# Patient Record
Sex: Female | Born: 1980 | Race: White | Hispanic: No | Marital: Married | Smoking: Never smoker
Health system: Southern US, Community
[De-identification: ages and names within clinical notes are randomized; demographics above are authoritative.]

## PROBLEM LIST (undated history)

## (undated) DIAGNOSIS — Z87442 Personal history of urinary calculi: Secondary | ICD-10-CM

## (undated) DIAGNOSIS — T4145XA Adverse effect of unspecified anesthetic, initial encounter: Secondary | ICD-10-CM

## (undated) DIAGNOSIS — K429 Umbilical hernia without obstruction or gangrene: Secondary | ICD-10-CM

## (undated) DIAGNOSIS — T8859XA Other complications of anesthesia, initial encounter: Secondary | ICD-10-CM

## (undated) HISTORY — PX: CHOLECYSTECTOMY: SHX55

## (undated) HISTORY — PX: UMBILICAL HERNIA REPAIR: SHX196

## (undated) HISTORY — PX: TUBAL LIGATION: SHX77

## (undated) HISTORY — PX: WISDOM TOOTH EXTRACTION: SHX21

## (undated) SURGERY — LAPAROSCOPY, DIAGNOSTIC
Anesthesia: Choice

---

## 2007-02-08 ENCOUNTER — Emergency Department: Payer: Self-pay | Admitting: Emergency Medicine

## 2007-02-08 ENCOUNTER — Other Ambulatory Visit: Payer: Self-pay

## 2008-03-20 ENCOUNTER — Emergency Department: Payer: Self-pay | Admitting: Emergency Medicine

## 2008-03-30 ENCOUNTER — Emergency Department: Payer: Self-pay | Admitting: Emergency Medicine

## 2008-06-16 ENCOUNTER — Observation Stay: Payer: Self-pay

## 2008-08-09 ENCOUNTER — Observation Stay: Payer: Self-pay | Admitting: Unknown Physician Specialty

## 2008-08-25 ENCOUNTER — Observation Stay: Payer: Self-pay | Admitting: Unknown Physician Specialty

## 2008-09-07 ENCOUNTER — Observation Stay: Payer: Self-pay | Admitting: Unknown Physician Specialty

## 2008-09-09 ENCOUNTER — Observation Stay: Payer: Self-pay

## 2008-09-15 ENCOUNTER — Observation Stay: Payer: Self-pay

## 2008-09-16 ENCOUNTER — Observation Stay: Payer: Self-pay | Admitting: Obstetrics and Gynecology

## 2008-09-18 ENCOUNTER — Inpatient Hospital Stay: Payer: Self-pay | Admitting: Obstetrics & Gynecology

## 2008-10-26 ENCOUNTER — Emergency Department: Payer: Self-pay | Admitting: Emergency Medicine

## 2008-10-30 ENCOUNTER — Ambulatory Visit: Payer: Self-pay | Admitting: Obstetrics and Gynecology

## 2008-11-03 ENCOUNTER — Ambulatory Visit: Payer: Self-pay | Admitting: Obstetrics and Gynecology

## 2009-10-17 ENCOUNTER — Emergency Department: Payer: Self-pay | Admitting: Unknown Physician Specialty

## 2010-04-28 ENCOUNTER — Ambulatory Visit: Payer: Self-pay | Admitting: Obstetrics and Gynecology

## 2010-04-29 ENCOUNTER — Encounter (INDEPENDENT_AMBULATORY_CARE_PROVIDER_SITE_OTHER): Payer: Self-pay | Admitting: *Deleted

## 2010-04-29 ENCOUNTER — Ambulatory Visit (HOSPITAL_COMMUNITY)
Admission: RE | Admit: 2010-04-29 | Discharge: 2010-04-29 | Payer: Self-pay | Source: Home / Self Care | Attending: Family Medicine | Admitting: Family Medicine

## 2010-04-29 LAB — CONVERTED CEMR LAB

## 2010-05-18 ENCOUNTER — Ambulatory Visit
Admission: RE | Admit: 2010-05-18 | Discharge: 2010-05-18 | Payer: Self-pay | Source: Home / Self Care | Attending: Obstetrics and Gynecology | Admitting: Obstetrics and Gynecology

## 2010-05-19 ENCOUNTER — Encounter (INDEPENDENT_AMBULATORY_CARE_PROVIDER_SITE_OTHER): Payer: Self-pay | Admitting: *Deleted

## 2010-05-19 LAB — CONVERTED CEMR LAB
HCT: 45.8 % (ref 36.0–46.0)
HCV Ab: NEGATIVE
HIV: NONREACTIVE
MCHC: 29.5 g/dL — ABNORMAL LOW (ref 30.0–36.0)
MCV: 93.7 fL (ref 78.0–100.0)
RBC: 4.89 M/uL (ref 3.87–5.11)
WBC: 8 10*3/uL (ref 4.0–10.5)

## 2010-08-15 ENCOUNTER — Emergency Department: Payer: Self-pay | Admitting: Internal Medicine

## 2010-08-16 ENCOUNTER — Inpatient Hospital Stay: Payer: Self-pay | Admitting: Surgery

## 2010-09-13 HISTORY — PX: OTHER SURGICAL HISTORY: SHX169

## 2010-09-27 NOTE — Assessment & Plan Note (Signed)
Rhonda Rodgers, Rhonda Rodgers            ACCOUNT NO.:  [id number]   MEDICAL RECORD NO.:  [id number]          PATIENT TYPE:  POB   LOCATION:  [facility] at [facility]         FACILITY:  [facility]   PHYSICIAN:  Catalina Antigua, MD     DATE OF BIRTH:  10/08/1980   DATE OF SERVICE:  04/28/2010                                  CLINIC NOTE   This is a 30 year old, G4, P3-0-1-3 with LMP of April 20, 2010, who  presents today for evaluation of pelvic pain.  The patient states that  she has had lower pelvic pain for at least 5 years and it has gotten  progressively worse over the course of the 5 years.  The patient states  that during the past 5 years, she has had 2 pregnancies and the pelvic  pain has gotten worse during her pregnancies. However, medical  evaluation did not reveal any gross abnormalities explaining her pain.  The patient has lost insurance following her pregnancy and was not able  to continue seeking care for this pelvic pain and presents today after  obtaining medical insurance to continue medical care to determine the  etiology of her pain.  The patient describes the pain as a constant  cramping pain that is present almost daily and has been relieved in the  past with Tylenol or ibuprofen, but right now these 2 agents do not seem  to alleviate her pain.  The patient states that the pain is nonradiating  and remains in her lower pelvic area.  The patient states that the pain  if any thing seems to improve during her periods.  The patient denies  any abnormal discharge or abnormal bleeding.   PAST MEDICAL HISTORY:  Significant for a remote history of asthma.  Never hospitalized or intubated.   PAST SURGICAL HISTORY:  She has had a cholecystectomy.   PAST OBSTETRICAL HISTORY:  She has had 3 full-term vaginal deliveries  and one miscarriage.   PAST GYNECOLOGIC HISTORY:  She denies any history of cyst, fibroids or  abnormal Pap smear.  She is married and sexually active with her  husband.  She uses birth control for contraception.  Her last Pap smear  was in October 2010.   SOCIAL HISTORY:  She denies drinking, smoking or the use of illicit  drugs.   FAMILY HISTORY:  Significant for diabetes, hypertension.  Her mother was  recently diagnosed at the age of 34 with breast cancer and had undergone  a lumpectomy and is currently undergoing chemoradiation.  She has an  uncle who was deceased at the age of 110 from pancreatic cancer.   REVIEW OF SYSTEMS:  Significant for pain with intercourse particularly  when her pelvic pain is present and is associated with the different  position.   MEDICATION:  She is not currently taking any medications and she denies  any drug allergies.   PHYSICAL EXAMINATION:  Her blood pressure is 128/88, pulse of 78, weight  of 271 pounds, height of 5 feet 9 inches.  Her abdomen is soft,  nontender, nondistended, obese and on pelvic exam she had normal-  appearing external genitalia.  No abnormal bleeding or she had a thin  white  discharge.  No odor association, normal-appearing vaginal mucosa  and cervix on bimanual exam.  Physical exam was limited secondary to  body habitus, but no appreciable adnexal masses or enlarged uterus were  palpated.   ASSESSMENT AND PLAN:  This is a 30 year old, gravida 4, para 3-0-1-3  with a longstanding history of pelvic pain which she describes in the  suprapubic area and a wet prep was collected.  Pelvic ultrasound was  ordered.  The patient were to return in 2 weeks for discussion of the  results as well as a full physical exam.  It was explained to the  patient that if all test results were found to be normal that perhaps a  referral to urologist would be needed to rule out the presence of  interstitial cystitis.  At which point, the patient states that a  previous doctor had mentioned that she may have a bladder problem, but  because of lack of insurance she never followed up.  Upon further   questioning, the patient states that she has regular bowel movements  that are nonpainful.  The patient will return as mentioned in 2-3 weeks  for discussion of the results for physical exam and further planning.           ______________________________  Catalina Antigua, MD     PC/MEDQ  D:  04/28/2010  T:  04/29/2010  Job:  [id number]

## 2010-09-27 NOTE — Assessment & Plan Note (Signed)
Rhonda Rodgers, Rhonda Rodgers            ACCOUNT NO.:  [id number]   MEDICAL RECORD NO.:  [id number]          PATIENT TYPE:  POB   LOCATION:  [facility] at [facility]         FACILITY:  [facility]   PHYSICIAN:  Catalina Antigua, MD     DATE OF BIRTH:  06-Feb-1981   DATE OF SERVICE:  05/18/2010                                  CLINIC NOTE   This is a 30 year old G4, P 3-0-1-3 with LMP of April 20, 2010, who  presents today for annual exam and discussion of their results.  Ultrasound ordered for evaluation of chronic pelvic pain.  The patient  reports no change in her symptoms since her last visit on April 28, 2010, and states that her pelvic pain remains constant, seems to be  getting worse towards the end of the day, is aggravated by having the  need to have a bowel movement but not alleviated following the bowel  movement.  The patient also gives a history of changes of her bowel  movement approximately 15 months ago where she would have normal bowel  movement followed by a period of diarrhea, but then return to normal  bowel movements.  The patient denies any blood in her stools.  It has  never had been evaluated by a gastroenterologist.  The patient states  that her pain was improved on the Percocet but never resolved  completely.  Results of the ultrasounds were reviewed with the patient,  which demonstrated a uterus measuring 8 x 4 x 5 cm with an endometrial  lining of 5.5 mm.  Normal-appearing right and left ovary.  No pelvic  fluid or adnexal masses were seen.   PHYSICAL EXAMINATION:  VITAL SIGNS:  Her blood pressure is 112/80, pulse  of 71, weight of 273 pounds, height of 5 feet 9 inches.  LUNGS:  Clear to auscultation bilaterally.  HEART:  Regular rate and rhythm.  BREASTS:  Equal in size, nontender.  No palpable masses or  lymphadenopathy.  No expressible nipple discharge.  No skin dimpling.  ABDOMEN:  Soft, nontender, nondistended, obese.  PELVIC:  It was limited secondary to body  habitus but no palpable masses  or tenderness was elicited on exam.   ASSESSMENT AND PLAN:  This is a 30 year old G18, P 3-0-1-3 with 5-year  history of pelvic pain who presents today for followup and a physical  exam.  Pap smear and cultures were collected.  The patient also desired  full STD testing and was tested for hepatitis B, C, HIV, and syphilis.  The patient will be contacted with any abnormal results.  The patient  was referred to a gastroenterologist in view of her GI symptoms to rule  out irritable bowel syndrome.  If the GI workup turns out to be  negative, perhaps a referral to a urologist to rule out interstitial  cystitis would be warranted.  The patient verbalized understanding.  All  questions were answered.  The patient will return in a year or p.r.n.           ______________________________  Catalina Antigua, MD     PC/MEDQ  D:  05/18/2010  T:  05/18/2010  Job:  [id number]

## 2010-11-29 ENCOUNTER — Encounter: Payer: Self-pay | Admitting: Family Medicine

## 2010-11-29 ENCOUNTER — Ambulatory Visit (INDEPENDENT_AMBULATORY_CARE_PROVIDER_SITE_OTHER): Payer: No Typology Code available for payment source | Admitting: Family Medicine

## 2010-11-29 DIAGNOSIS — E669 Obesity, unspecified: Secondary | ICD-10-CM | POA: Insufficient documentation

## 2010-11-29 DIAGNOSIS — N949 Unspecified condition associated with female genital organs and menstrual cycle: Secondary | ICD-10-CM

## 2010-11-29 DIAGNOSIS — R102 Pelvic and perineal pain: Secondary | ICD-10-CM | POA: Insufficient documentation

## 2010-11-29 NOTE — Progress Notes (Signed)
  Subjective:    Patient ID: Rhonda Rodgers, female    DOB: 03/17/1981, 30 y.o.   MRN: [id number]  HPI Comments: Here today for continued complaints of pelvic pain.  She has not completed GI work-up and has had emergency herniorrhaphy since last visit.  She has pain that is low in the midline and crampy in nature.  It is not cyclical in nature.  She has a normal pelvic sonogram.  I am unclear that her pain is gynecological.  She is using Ibuprofen to control it, but it is not working well.  She has no specific GI or GU symptoms.    Pelvic Pain The patient's primary symptoms include pelvic pain. The patient's pertinent negatives include no vaginal discharge. Pertinent negatives include no abdominal pain, back pain, fever, flank pain, headaches or hematuria.      Review of Systems  Constitutional: Positive for activity change (calls out of work frequently). Negative for fever.  Respiratory: Negative for shortness of breath and wheezing.   Cardiovascular: Negative for chest pain.  Gastrointestinal: Negative for abdominal pain and abdominal distention.  Genitourinary: Positive for pelvic pain. Negative for hematuria, flank pain, vaginal bleeding, vaginal discharge, vaginal pain and menstrual problem.  Musculoskeletal: Negative for myalgias and back pain.  Neurological: Negative for syncope and headaches.  Psychiatric/Behavioral: Negative for dysphoric mood. The patient is not nervous/anxious.        Objective:   Physical Exam  Constitutional: She appears well-developed and well-nourished.  HENT:  Head: Normocephalic and atraumatic.  Neck: Normal range of motion. Neck supple.  Cardiovascular: Normal rate and regular rhythm.   Pulmonary/Chest: Effort normal.  Abdominal: Soft. Bowel sounds are normal. There is no tenderness.          Assessment & Plan:  Pelvic Pain/Abdominal pain-unclear etiology.  ? GYN vs GI vs. IC.  Certainly is not typical of any.  Pt. Is asking about  hysterectomy--Lengthy discussion about hysterectomy for no pathology which may have complications-discussed at length, and may not fix pain.  We have compromised with her returning to GI and scheduling a diagnostic laparoscopy.  Pt. With previous hernia---will consider RUQ camera placement.

## 2010-12-14 HISTORY — PX: OTHER SURGICAL HISTORY: SHX169

## 2010-12-28 ENCOUNTER — Encounter (HOSPITAL_COMMUNITY)
Admission: RE | Admit: 2010-12-28 | Discharge: 2010-12-28 | Disposition: A | Discharge status: Home / Self Care | Payer: BC Managed Care – PPO | Source: Ambulatory Visit | Attending: Family Medicine | Admitting: Family Medicine

## 2010-12-28 ENCOUNTER — Encounter (HOSPITAL_COMMUNITY): Payer: Self-pay

## 2010-12-28 HISTORY — DX: Other complications of anesthesia, initial encounter: T88.59XA

## 2010-12-28 HISTORY — DX: Adverse effect of unspecified anesthetic, initial encounter: T41.45XA

## 2010-12-28 LAB — CBC
HCT: 41 % (ref 36.0–46.0)
Platelets: 220 10*3/uL (ref 150–400)
RBC: 4.79 MIL/uL (ref 3.87–5.11)
RDW: 13.5 % (ref 11.5–15.5)
WBC: 5.8 10*3/uL (ref 4.0–10.5)

## 2010-12-28 NOTE — Patient Instructions (Signed)
20 WILLISTINE FERRALL  12/28/2010   Your procedure is scheduled on:  01/09/11  Report to [facility] at 0730 AM.  Call this number if you have problems the morning of surgery: [phone]   Remember:   Do not eat food:After Midnight.  Do not drink clear liquids: After Midnight.  Take these medicines the morning of surgery with A SIP OF WATER: none   Do not wear jewelry, make-up or nail polish.  Do not wear lotions, powders, or perfumes. You may wear deodorant.  Do not shave 48 hours prior to surgery.  Do not bring valuables to the hospital.  Contacts, dentures or bridgework may not be worn into surgery.  Leave suitcase in the car. After surgery it may be brought to your room.  For patients admitted to the hospital, checkout time is 11:00 AM the day of discharge.   Patients discharged the day of surgery will not be allowed to drive home.  Name and phone number of your driver:Richard -[phone]  Special Instructions: CHG Shower Use Special Wash: 1/2 bottle night before surgery and 1/2 bottle morning of surgery.   Please read over the following fact sheets that you were given: MRSA

## 2011-01-09 ENCOUNTER — Encounter (HOSPITAL_COMMUNITY)
Admission: RE | Disposition: A | Discharge status: Home / Self Care | Payer: Self-pay | Source: Ambulatory Visit | Attending: Family Medicine

## 2011-01-09 ENCOUNTER — Ambulatory Visit (HOSPITAL_COMMUNITY): Payer: BC Managed Care – PPO | Admitting: Anesthesiology

## 2011-01-09 ENCOUNTER — Encounter (HOSPITAL_COMMUNITY): Payer: Self-pay | Admitting: Anesthesiology

## 2011-01-09 ENCOUNTER — Ambulatory Visit (HOSPITAL_COMMUNITY)
Admission: RE | Admit: 2011-01-09 | Discharge: 2011-01-09 | Disposition: A | Discharge status: Home / Self Care | Payer: BC Managed Care – PPO | Source: Ambulatory Visit | Attending: Family Medicine | Admitting: Family Medicine

## 2011-01-09 ENCOUNTER — Encounter (HOSPITAL_COMMUNITY): Payer: Self-pay | Admitting: *Deleted

## 2011-01-09 DIAGNOSIS — IMO0002 Reserved for concepts with insufficient information to code with codable children: Secondary | ICD-10-CM

## 2011-01-09 DIAGNOSIS — Z9889 Other specified postprocedural states: Secondary | ICD-10-CM

## 2011-01-09 DIAGNOSIS — Z01818 Encounter for other preprocedural examination: Secondary | ICD-10-CM | POA: Insufficient documentation

## 2011-01-09 DIAGNOSIS — Z01812 Encounter for preprocedural laboratory examination: Secondary | ICD-10-CM | POA: Insufficient documentation

## 2011-01-09 DIAGNOSIS — R102 Pelvic and perineal pain: Secondary | ICD-10-CM

## 2011-01-09 DIAGNOSIS — N949 Unspecified condition associated with female genital organs and menstrual cycle: Secondary | ICD-10-CM | POA: Insufficient documentation

## 2011-01-09 HISTORY — PX: LAPAROSCOPY: SHX197

## 2011-01-09 LAB — PREGNANCY, URINE: Preg Test, Ur: NEGATIVE

## 2011-01-09 SURGERY — LAPAROSCOPY, DIAGNOSTIC
Anesthesia: General | Site: Abdomen | Wound class: Clean Contaminated

## 2011-01-09 MED ORDER — PROPOFOL 10 MG/ML IV EMUL
INTRAVENOUS | Status: DC | PRN
  Administered 2011-01-09: 200 mg via INTRAVENOUS

## 2011-01-09 MED ORDER — KETOROLAC TROMETHAMINE 30 MG/ML IJ SOLN
INTRAMUSCULAR | Status: DC | PRN
  Administered 2011-01-09: 30 mg via INTRAVENOUS

## 2011-01-09 MED ORDER — BUPIVACAINE HCL (PF) 0.25 % IJ SOLN
INTRAMUSCULAR | Status: DC | PRN
  Administered 2011-01-09: 4 mL

## 2011-01-09 MED ORDER — CEFAZOLIN SODIUM 1-5 GM-% IV SOLN: 1.0000 g | INTRAVENOUS | Status: DC

## 2011-01-09 MED ORDER — MIDAZOLAM HCL 2 MG/2ML IJ SOLN
INTRAMUSCULAR | Status: AC
  Filled 2011-01-09: qty 2

## 2011-01-09 MED ORDER — LACTATED RINGERS IV SOLN
INTRAVENOUS | Status: DC
  Administered 2011-01-09: 1000 mL via INTRAVENOUS
  Administered 2011-01-09: 10:00:00 via INTRAVENOUS

## 2011-01-09 MED ORDER — LIDOCAINE HCL (CARDIAC) 20 MG/ML IV SOLN
INTRAVENOUS | Status: AC
  Filled 2011-01-09: qty 5

## 2011-01-09 MED ORDER — CEFAZOLIN SODIUM 1-5 GM-% IV SOLN
INTRAVENOUS | Status: AC
  Filled 2011-01-09: qty 50

## 2011-01-09 MED ORDER — NEOSTIGMINE METHYLSULFATE 1 MG/ML IJ SOLN
INTRAMUSCULAR | Status: DC | PRN
  Administered 2011-01-09: 5 mg via INTRAMUSCULAR

## 2011-01-09 MED ORDER — OXYCODONE-ACETAMINOPHEN 5-325 MG PO TABS
ORAL_TABLET | ORAL | Status: AC
  Administered 2011-01-09: 1
  Filled 2011-01-09: qty 1

## 2011-01-09 MED ORDER — OXYCODONE-ACETAMINOPHEN 5-325 MG PO TABS: 1.0000 | ORAL_TABLET | ORAL | Status: DC | PRN

## 2011-01-09 MED ORDER — ROCURONIUM BROMIDE 100 MG/10ML IV SOLN
INTRAVENOUS | Status: DC | PRN
  Administered 2011-01-09: 50 mg via INTRAVENOUS

## 2011-01-09 MED ORDER — OXYCODONE-ACETAMINOPHEN 5-325 MG PO TABS: 1.0000 | ORAL_TABLET | ORAL | Status: AC | PRN

## 2011-01-09 MED ORDER — CEFAZOLIN SODIUM 1-5 GM-% IV SOLN
INTRAVENOUS | Status: DC | PRN
  Administered 2011-01-09: 1 g via INTRAVENOUS

## 2011-01-09 MED ORDER — GLYCOPYRROLATE 0.2 MG/ML IJ SOLN
INTRAMUSCULAR | Status: AC
  Filled 2011-01-09: qty 1

## 2011-01-09 MED ORDER — MIDAZOLAM HCL 5 MG/5ML IJ SOLN
INTRAMUSCULAR | Status: DC | PRN
  Administered 2011-01-09: 2 mg via INTRAVENOUS

## 2011-01-09 MED ORDER — ONDANSETRON HCL 4 MG/2ML IJ SOLN
INTRAMUSCULAR | Status: AC
  Filled 2011-01-09: qty 2

## 2011-01-09 MED ORDER — LIDOCAINE HCL (CARDIAC) 20 MG/ML IV SOLN
INTRAVENOUS | Status: DC | PRN
  Administered 2011-01-09: 100 mg via INTRAVENOUS

## 2011-01-09 MED ORDER — FENTANYL CITRATE 0.05 MG/ML IJ SOLN
INTRAMUSCULAR | Status: AC
  Filled 2011-01-09: qty 5

## 2011-01-09 MED ORDER — FENTANYL CITRATE 0.05 MG/ML IJ SOLN
INTRAMUSCULAR | Status: DC | PRN
  Administered 2011-01-09: 150 ug via INTRAVENOUS
  Administered 2011-01-09: 100 ug via INTRAVENOUS

## 2011-01-09 MED ORDER — DEXAMETHASONE SODIUM PHOSPHATE 4 MG/ML IJ SOLN
INTRAMUSCULAR | Status: DC | PRN
  Administered 2011-01-09: 10 mg via INTRAVENOUS

## 2011-01-09 MED ORDER — PROPOFOL 10 MG/ML IV EMUL
INTRAVENOUS | Status: AC
  Filled 2011-01-09: qty 20

## 2011-01-09 MED ORDER — NEOSTIGMINE METHYLSULFATE 1 MG/ML IJ SOLN
INTRAMUSCULAR | Status: AC
  Filled 2011-01-09: qty 10

## 2011-01-09 MED ORDER — ROCURONIUM BROMIDE 50 MG/5ML IV SOLN
INTRAVENOUS | Status: AC
  Filled 2011-01-09: qty 1

## 2011-01-09 MED ORDER — ONDANSETRON HCL 4 MG/2ML IJ SOLN
INTRAMUSCULAR | Status: DC | PRN
  Administered 2011-01-09: 4 mg via INTRAVENOUS

## 2011-01-09 MED ORDER — DEXAMETHASONE SODIUM PHOSPHATE 10 MG/ML IJ SOLN
INTRAMUSCULAR | Status: AC
  Filled 2011-01-09: qty 1

## 2011-01-09 MED ORDER — GLYCOPYRROLATE 0.2 MG/ML IJ SOLN
INTRAMUSCULAR | Status: DC | PRN
  Administered 2011-01-09: .8 mg via INTRAVENOUS

## 2011-01-09 SURGICAL SUPPLY — 23 items
CABLE HIGH FREQUENCY MONO STRZ (ELECTRODE) IMPLANT
CATH ROBINSON RED A/P 16FR (CATHETERS) IMPLANT
CHLORAPREP W/TINT 26ML (MISCELLANEOUS) ×2 IMPLANT
CLOTH BEACON ORANGE TIMEOUT ST (SAFETY) ×2 IMPLANT
DERMABOND ADVANCED (GAUZE/BANDAGES/DRESSINGS) ×2 IMPLANT
GLOVE BIOGEL PI IND STRL 7.0 (GLOVE) ×1 IMPLANT
GLOVE BIOGEL PI INDICATOR 7.0 (GLOVE) ×1
GLOVE ECLIPSE 7.0 STRL STRAW (GLOVE) ×4 IMPLANT
GOWN PREVENTION PLUS LG XLONG (DISPOSABLE) ×4 IMPLANT
GOWN PREVENTION PLUS XLARGE (GOWN DISPOSABLE) ×2 IMPLANT
NS IRRIG 1000ML POUR BTL (IV SOLUTION) ×2 IMPLANT
PACK LAPAROSCOPY BASIN (CUSTOM PROCEDURE TRAY) ×2 IMPLANT
SET IRRIG TUBING LAPAROSCOPIC (IRRIGATION / IRRIGATOR) IMPLANT
SUT VIC AB 3-0 X1 27 (SUTURE) ×2 IMPLANT
SUT VICRYL 0 UR6 27IN ABS (SUTURE) ×4 IMPLANT
SUT VICRYL 4-0 PS2 18IN ABS (SUTURE) ×2 IMPLANT
TOWEL OR 17X24 6PK STRL BLUE (TOWEL DISPOSABLE) ×4 IMPLANT
TRAY FOLEY CATH 14FR (SET/KITS/TRAYS/PACK) ×2 IMPLANT
TROCAR BALLN 12MMX100 BLUNT (TROCAR) IMPLANT
TROCAR Z-THREAD BLADED 11X100M (TROCAR) IMPLANT
TROCAR Z-THREAD BLADED 5X100MM (TROCAR) ×4 IMPLANT
WARMER LAPAROSCOPE (MISCELLANEOUS) ×2 IMPLANT
WATER STERILE IRR 1000ML POUR (IV SOLUTION) ×2 IMPLANT

## 2011-01-09 NOTE — Op Note (Signed)
Preoperative diagnosis: Pelvic pain  Postoperative diagnosis: Same Procedure: Diagnostic laparoscopy Surgeon: Tinnie Gens, MD Anesthesia:  GETT-Hatchett Findings: Normal appearing uterus, tubes, ovaries.  Fallope ring noted on left tube, possible venous congestion.  No evidence of endometriosis.  Normal anterior and posterior cul-de-sacs.  Normal liver edge, no significant adhesive disease. Estimated blood loss: Minimal Complications: None known Specimens: None Reason for procedure: Patient is a 30 y.o. [id number], with long standing history of chronic pelvic pain.  She has had pain that has worsened with pregnancy.  She is being worked up by GI, and is for laparoscopy for diagnostic purposes. Procedure: Patient was taken to the operating room was placed in dorsal lithotomy in Clemons stirrups. She was prepped and draped in the usual sterile fashion. A timeout was performed. The patient received 1 g of Ancef and SCDs were in place. Foley catheter is used to drain bladder. Speculum was placed inside the vagina. The cervix was visualized and grasped anteriorly with a single-tooth tenaculum. A Hulka tenaculum was placed through the cervix for uterine manipulation. The single tooth tenaculum and speculum were removed from the vagina.  Attention was then turned to the abdomen. Four cc of 0.25% Marcaine was injected at the umbilicus.  Further cleaning with Betadine and swabs and 4 x 4's were used to continue to clean the umbilicus. Two Allis clamps were used to tent up the skin of the umbilicus a vertical one half centimeter incision was made here. The fascia was incised with the knife  And the peritoneum tented was entered sharply with this incision. Two edges of the fascia were tagged with a 0 Vicryl suture on a UR 6 needle. A Hassan trocar was placed through this incision and a pneumoperitoneum was created. The patient was then placed in Trendelenburg. The pelvis was inspected in a systematic fashion. The  patient's right and left tubes, presence of Fallope ring on left tube, and ovaries appeared normal. There was evidence of vascular congestion noted bilaterally.  The posterior and anterior cul-de-sacs were inspected and felt to be normal. The ovarian fossa were inspected and also felt to be normal. The appendix was not seen.. The upper abdomen was inspected the liver edge gallbladder and stomach appeared normal. There were no additional findings in the pelvis and so the procedure was terminated. All instrument, needle  and lap counts were correct x 2 the patient was awakened to recovery in stable condition.

## 2011-01-09 NOTE — Anesthesia Procedure Notes (Signed)
Procedure Name: Intubation Date/Time: 01/09/2011 9:20 AM Performed by: Isabella Bowens Pre-anesthesia Checklist: Patient identified, Emergency Drugs available, Suction available, Timeout performed and Patient being monitored Patient Re-evaluated:Patient Re-evaluated prior to inductionOxygen Delivery Method: Circle System Utilized Preoxygenation: Pre-oxygenation with 100% oxygen Intubation Type: IV induction Ventilation: Mask ventilation without difficulty Laryngoscope Size: Mac and 3 Grade View: Grade III Tube type: Oral Tube size: 7.0 mm Number of attempts: 1 Airway Equipment and Method: stylet Placement Confirmation: ETT inserted through vocal cords under direct vision,  positive ETCO2 and breath sounds checked- equal and bilateral Secured at: 22 cm Tube secured with: Tape Dental Injury: Teeth and Oropharynx as per pre-operative assessment

## 2011-01-09 NOTE — Anesthesia Postprocedure Evaluation (Signed)
Anesthesia Post Note  Patient: Rhonda Rodgers  Procedure(s) Performed:  LAPAROSCOPY DIAGNOSTIC  Anesthesia type: General  Patient location: PACU  Post pain: Pain level controlled  Post assessment: Post-op Vital signs reviewed  Last Vitals:  Filed Vitals:   01/09/11 1014  BP: 110/54  Pulse: 80  Temp: 97.9 F (36.6 C)  Resp: 16    Post vital signs: Reviewed  Level of consciousness: sedated  Complications: No apparent anesthesia complicationsfj

## 2011-01-09 NOTE — Transfer of Care (Signed)
  Anesthesia Post-op Note  Patient: Rhonda Rodgers  Procedure(s) Performed:  LAPAROSCOPY DIAGNOSTIC  Patient Location: PACU  Anesthesia Type: General  Level of Consciousness: sedated and patient cooperative  Airway and Oxygen Therapy: Patient Spontanous Breathing and Patient connected to nasal cannula oxygen  Post-op Pain: mild  Post-op Assessment: Post-op Vital signs reviewed  Post-op Vital Signs: Reviewed and stable  Complications: No apparent anesthesia complications

## 2011-01-09 NOTE — Anesthesia Preprocedure Evaluation (Addendum)
Anesthesia Evaluation  Name, MR# and DOB Patient awake  General Assessment Comment  Reviewed: Allergy & Precautions, H&P , Patient's Chart, lab work & pertinent test results, reviewed documented beta blocker date and time   History of Anesthesia Complications Negative for: history of anesthetic complications  Airway Mallampati: II TM Distance: >3 FB Neck ROM: full    Dental  (+) Teeth Intact   Pulmonary  asthma  clear to auscultation  pulmonary exam normalPulmonary Exam Normal breath sounds clear to auscultation none    Cardiovascular     Neuro/Psych Negative Neurological ROS  Negative Psych ROS  GI/Hepatic/Renal negative GI ROS  negative Liver ROS  negative Renal ROS        Endo/Other  (+)   Morbid obesity  Abdominal Normal abdominal exam  (+) obese,   Musculoskeletal negative musculoskeletal ROS (+)   Hematology negative hematology ROS (+)   Peds  Reproductive/Obstetrics negative OB ROS    Anesthesia Other Findings            Anesthesia Physical Anesthesia Plan  ASA: III  Anesthesia Plan: General   Post-op Pain Management:    Induction: Intravenous  Airway Management Planned: Oral ETT  Additional Equipment:   Intra-op Plan:   Post-operative Plan: Extubation in OR  Informed Consent: I have reviewed the patients History and Physical, chart, labs and discussed the procedure including the risks, benefits and alternatives for the proposed anesthesia with the patient or authorized representative who has indicated his/her understanding and acceptance.   Dental Advisory Given and History available from chart only  Plan Discussed with: CRNA  Anesthesia Plan Comments:       Anesthesia Quick Evaluation

## 2011-01-09 NOTE — H&P (Signed)
Rhonda Rodgers is an 30 y.o. female. She has chronic pelvic pain of unclear etiology.  Pertinent Gynecological History:  Previous GYN Procedures: BTL   Last pap: normal Date:  OB History: G4, P3013   Menstrual History:  Patient's last menstrual period was 12/14/2010.    Past Medical History  Diagnosis Date  . Asthma     yrs ago  . Complication of anesthesia     claustrobia- doesn't want O2 mask on face- will become agitated    Past Surgical History  Procedure Date  . Cholecystectomy   . Herina excision 09/2010  . Umbilical hernia repair   . Tubal ligation     Family History  Problem Relation Age of Onset  . Diabetes Mother   . Cancer Mother   . Hypertension Father     Social History:  reports that she has never smoked. She has never used smokeless tobacco. She reports that she does not drink alcohol or use illicit drugs.  Allergies: No Known Allergies  Prescriptions prior to admission  Medication Sig Dispense Refill  . acetaminophen (TYLENOL) 500 MG tablet Take 500 mg by mouth as needed. Pt. Takes 1-2 tablest per day in between ibuprofen as needed for pain.       Marland Kitchen ibuprofen (ADVIL,MOTRIN) 800 MG tablet Take 800 mg by mouth every 8 (eight) hours as needed.          Review of Systems  Constitutional: Negative for fever, chills, malaise/fatigue and diaphoresis.  HENT: Negative for neck pain.   Respiratory: Negative for cough and hemoptysis.   Cardiovascular: Negative for chest pain and leg swelling.  Gastrointestinal: Positive for abdominal pain. Negative for nausea and vomiting.  Genitourinary: Negative for dysuria and urgency.  Musculoskeletal: Negative for myalgias and back pain.  Neurological: Negative for dizziness.  Psychiatric/Behavioral: Negative for depression and suicidal ideas.    Blood pressure 110/71, pulse 72, temperature 98.1 F (36.7 C), temperature source Oral, resp. rate 18, last menstrual period 12/14/2010, SpO2 98.00%. Physical Exam    Constitutional: She appears well-developed and well-nourished.  HENT:  Head: Normocephalic.  Neck: Normal range of motion. Neck supple.  Cardiovascular: Normal rate and regular rhythm.   Respiratory: Effort normal and breath sounds normal.  GI: Bowel sounds are normal. There is tenderness.  Genitourinary: Vagina normal and uterus normal.  Musculoskeletal: Normal range of motion.  Neurological: She is alert.  Skin: Skin is warm.  Psychiatric: She has a normal mood and affect.    Results for orders placed during the hospital encounter of 01/09/11 (from the past 24 hour(s))  PREGNANCY, URINE     Status: Normal   Collection Time   01/09/11  7:45 AM      Component Value Range   Preg Test, Ur NEGATIVE      No results found.  Assessment/Plan: Chronic pelvic pain of unclear etiology. For diagnostic laparoscopy.  Tenesha Garza S 01/09/2011, 8:13 AM

## 2011-01-24 ENCOUNTER — Ambulatory Visit (INDEPENDENT_AMBULATORY_CARE_PROVIDER_SITE_OTHER): Payer: No Typology Code available for payment source | Admitting: Family Medicine

## 2011-01-24 ENCOUNTER — Encounter: Payer: Self-pay | Admitting: Family Medicine

## 2011-01-24 VITALS — BP 112/76 | HR 67 | Wt 272.0 lb

## 2011-01-24 DIAGNOSIS — Z9889 Other specified postprocedural states: Secondary | ICD-10-CM

## 2011-01-24 DIAGNOSIS — R102 Pelvic and perineal pain: Secondary | ICD-10-CM

## 2011-01-24 DIAGNOSIS — N949 Unspecified condition associated with female genital organs and menstrual cycle: Secondary | ICD-10-CM

## 2011-01-24 NOTE — Assessment & Plan Note (Signed)
?   Pelvic congestion syndrome--? Interventional radiology--venography and sclerotherapy

## 2011-01-24 NOTE — Progress Notes (Signed)
Addended by: Reva Bores on: 01/24/2011 02:44 PM   Modules accepted: Orders

## 2011-01-24 NOTE — Progress Notes (Signed)
  Subjective:    Patient ID: Rhonda Rodgers, female    DOB: 1981-04-17, 30 y.o.   MRN: [id number]  HPI S/p dx laparoscopy for chronic pelvic pain.  Pt. Continues to have pelvic pain.  Pt. Had a nml laparoscopy except for pelvic congestion, which may be the cause of her pain.  Had negative colonoscopy 3 wks ago.  GI placed pt. On probiotics.   Review of Systems  Constitutional: Negative for activity change.  HENT: Negative for sinus pressure.   Respiratory: Negative for shortness of breath.   Cardiovascular: Negative for chest pain.  Gastrointestinal: Negative for abdominal pain and constipation.  Genitourinary: Positive for pelvic pain. Negative for dysuria.  Musculoskeletal: Negative for arthralgias.       Objective:   Physical Exam  Vitals reviewed. Constitutional: She appears well-developed and well-nourished.  HENT:  Head: Normocephalic and atraumatic.  Cardiovascular: Normal rate.   Pulmonary/Chest: Effort normal.  Abdominal: Soft.  Skin: Skin is warm and dry.       Incision is healing well.          Assessment & Plan:  ? Pelvic congestion syndrome--will discuss with Interventional Radiology about venography for diagnosis +/- sclerotherapy for treatment. Discussed with Dr. Bonnielee Haff and he will call patient and schedule venography.

## 2011-01-25 ENCOUNTER — Ambulatory Visit
Admission: RE | Admit: 2011-01-25 | Discharge: 2011-01-25 | Disposition: A | Discharge status: Home / Self Care | Payer: No Typology Code available for payment source | Source: Ambulatory Visit | Attending: Family Medicine | Admitting: Family Medicine

## 2011-01-25 VITALS — BP 126/69 | HR 87 | Temp 97.8°F | Resp 14 | Ht 69.0 in | Wt 270.0 lb

## 2011-01-25 DIAGNOSIS — R102 Pelvic and perineal pain: Secondary | ICD-10-CM

## 2011-02-01 ENCOUNTER — Other Ambulatory Visit (HOSPITAL_COMMUNITY): Payer: Self-pay | Admitting: Interventional Radiology

## 2011-02-01 DIAGNOSIS — N9489 Other specified conditions associated with female genital organs and menstrual cycle: Secondary | ICD-10-CM

## 2011-02-28 ENCOUNTER — Ambulatory Visit (HOSPITAL_COMMUNITY): Payer: BC Managed Care – PPO

## 2011-02-28 ENCOUNTER — Observation Stay (HOSPITAL_COMMUNITY)
Admission: AD | Admit: 2011-02-28 | Discharge: 2011-03-01 | Disposition: A | Discharge status: Home / Self Care | Payer: BC Managed Care – PPO | Source: Ambulatory Visit | Attending: Interventional Radiology | Admitting: Interventional Radiology

## 2011-02-28 ENCOUNTER — Other Ambulatory Visit (HOSPITAL_COMMUNITY): Payer: Self-pay | Admitting: Interventional Radiology

## 2011-02-28 DIAGNOSIS — N9489 Other specified conditions associated with female genital organs and menstrual cycle: Secondary | ICD-10-CM

## 2011-02-28 HISTORY — PX: EMBOLIZATION: SHX1496

## 2011-02-28 LAB — BASIC METABOLIC PANEL
Chloride: 104 mEq/L (ref 96–112)
GFR calc Af Amer: >90 mL/min (ref >90)
GFR calc non Af Amer: >90 mL/min (ref >90)
Potassium: 3.7 mEq/L (ref 3.5–5.1)
Sodium: 137 mEq/L (ref 135–145)

## 2011-02-28 LAB — CBC
HCT: 38.4 % (ref 36.0–46.0)
MCHC: 32.3 g/dL (ref 30.0–36.0)
Platelets: 223 10*3/uL (ref 150–400)
RDW: 13.8 % (ref 11.5–15.5)
WBC: 5.8 10*3/uL (ref 4.0–10.5)

## 2011-02-28 LAB — HCG, SERUM, QUALITATIVE: Preg, Serum: NEGATIVE

## 2011-02-28 MED ORDER — IOHEXOL 300 MG/ML  SOLN: 110.0000 mL | Freq: Once | INTRAMUSCULAR | Status: AC | PRN

## 2011-03-01 ENCOUNTER — Other Ambulatory Visit: Payer: Self-pay | Admitting: Interventional Radiology

## 2011-03-01 DIAGNOSIS — N9489 Other specified conditions associated with female genital organs and menstrual cycle: Secondary | ICD-10-CM

## 2011-03-06 ENCOUNTER — Telehealth: Payer: Self-pay | Admitting: Emergency Medicine

## 2011-03-06 NOTE — Telephone Encounter (Signed)
See above c/c from pt. Will page Caryn Bee, [place] at [facility] to contact patient.  3:20pm paged Melody Comas- working at [facility], said to call Beckey Downing. 3:32- paged Beckey Downing, [place]- she will contact the patient.

## 2011-04-04 ENCOUNTER — Ambulatory Visit
Admission: RE | Admit: 2011-04-04 | Discharge: 2011-04-04 | Disposition: A | Discharge status: Home / Self Care | Payer: No Typology Code available for payment source | Source: Ambulatory Visit | Attending: Interventional Radiology | Admitting: Interventional Radiology

## 2011-04-04 VITALS — BP 128/63 | HR 90 | Temp 98.1°F | Resp 16 | Ht 69.0 in | Wt 220.0 lb

## 2011-04-04 DIAGNOSIS — N9489 Other specified conditions associated with female genital organs and menstrual cycle: Secondary | ICD-10-CM

## 2011-04-04 NOTE — Progress Notes (Signed)
Pt states that she continues to experience abdominal & pelvic cramping but w/ decreased intensity post procedure.  Only requires Tylenol 500 mg one tab/day.    Otherwise no complaints.

## 2011-07-10 ENCOUNTER — Ambulatory Visit (INDEPENDENT_AMBULATORY_CARE_PROVIDER_SITE_OTHER): Payer: BC Managed Care – PPO | Admitting: Family Medicine

## 2011-07-10 ENCOUNTER — Encounter: Payer: Self-pay | Admitting: Family Medicine

## 2011-07-10 VITALS — BP 114/85 | HR 85 | Ht 69.0 in | Wt 281.0 lb

## 2011-07-10 DIAGNOSIS — N649 Disorder of breast, unspecified: Secondary | ICD-10-CM

## 2011-07-10 DIAGNOSIS — K429 Umbilical hernia without obstruction or gangrene: Secondary | ICD-10-CM

## 2011-07-10 NOTE — Progress Notes (Signed)
  Subjective:    Patient ID: Rhonda Rodgers, female    DOB: [birth year], 31 y.o.   MRN: [id number]  HPI  Found lump on right breast underneath.  It is mildly tender.  Been there about one week and came with menses.  Mom diagnosed with breast ca at age 73.    Review of Systems  Constitutional: Negative for fever.  Respiratory: Negative for cough and shortness of breath.   Gastrointestinal: Negative for nausea, vomiting and abdominal pain.       Objective:   Physical Exam  Vitals reviewed. Constitutional: She appears well-developed and well-nourished.  HENT:  Head: Normocephalic and atraumatic.  Eyes: No scleral icterus.  Neck: Neck supple.  Cardiovascular: Normal rate.   Pulmonary/Chest: Effort normal. Right breast exhibits skin change. Right breast exhibits no inverted nipple.       There is a 1.2 cm x 1 cm small, firm nodule, likely a skin inclusion cyst.  Abdominal: Soft.       >1cm umbilical hernia noted, reducible.          Assessment & Plan:   1. Lesion of skin of breast  US Breast Right   Advised to see gen surg. For possible treatment and for eval of umbilical hernia.

## 2011-07-10 NOTE — Patient Instructions (Signed)

## 2011-07-11 NOTE — Progress Notes (Signed)
Addended by: Barbara Cower on: 07/11/2011 01:08 PM   Modules accepted: Orders

## 2011-07-11 NOTE — Progress Notes (Signed)
Addended by: Barbara Cower on: 07/11/2011 01:52 PM   Modules accepted: Orders

## 2011-07-19 ENCOUNTER — Ambulatory Visit
Admission: RE | Admit: 2011-07-19 | Discharge: 2011-07-19 | Disposition: A | Discharge status: Home / Self Care | Payer: BC Managed Care – PPO | Source: Ambulatory Visit | Attending: Family Medicine | Admitting: Family Medicine

## 2011-07-19 DIAGNOSIS — N649 Disorder of breast, unspecified: Secondary | ICD-10-CM

## 2011-08-01 ENCOUNTER — Ambulatory Visit: Payer: BC Managed Care – PPO | Admitting: Family Medicine

## 2011-10-18 ENCOUNTER — Emergency Department: Payer: Self-pay | Admitting: Emergency Medicine

## 2011-10-18 LAB — BASIC METABOLIC PANEL
Anion Gap: 7 (ref 7–16)
Calcium, Total: 8.7 mg/dL (ref 8.5–10.1)
Chloride: 107 mmol/L (ref 98–107)
Co2: 26 mmol/L (ref 21–32)
Osmolality: 277 (ref 275–301)
Potassium: 3.9 mmol/L (ref 3.5–5.1)

## 2011-10-18 LAB — URINALYSIS, COMPLETE
Glucose,UR: NEGATIVE mg/dL (ref 0–75)
Ketone: NEGATIVE
Nitrite: NEGATIVE
Ph: 5 (ref 4.5–8.0)
Protein: 30
RBC,UR: 55 /HPF (ref 0–5)
WBC UR: 3 /HPF (ref 0–5)

## 2011-10-18 LAB — CBC
HGB: 12.8 g/dL (ref 12.0–16.0)
MCH: 27.2 pg (ref 26.0–34.0)
MCV: 86 fL (ref 80–100)
RBC: 4.69 10*6/uL (ref 3.80–5.20)
RDW: 14.5 % (ref 11.5–14.5)
WBC: 5.7 10*3/uL (ref 3.6–11.0)

## 2011-10-23 ENCOUNTER — Ambulatory Visit: Payer: Self-pay | Admitting: Urology

## 2011-10-23 LAB — PREGNANCY, URINE: Pregnancy Test, Urine: NEGATIVE m[IU]/mL

## 2011-11-07 ENCOUNTER — Encounter: Payer: Self-pay | Admitting: Family Medicine

## 2012-02-01 ENCOUNTER — Telehealth: Payer: Self-pay | Admitting: *Deleted

## 2012-02-01 DIAGNOSIS — B9689 Other specified bacterial agents as the cause of diseases classified elsewhere: Secondary | ICD-10-CM

## 2012-02-01 MED ORDER — METRONIDAZOLE 500 MG PO TABS: 500.0000 mg | ORAL_TABLET | Freq: Two times a day (BID) | ORAL | Status: DC

## 2012-02-01 NOTE — Telephone Encounter (Signed)
Patient complains of yellow discharge, and irritation.

## 2012-02-13 ENCOUNTER — Ambulatory Visit (INDEPENDENT_AMBULATORY_CARE_PROVIDER_SITE_OTHER): Payer: BC Managed Care – PPO | Admitting: Obstetrics & Gynecology

## 2012-02-13 ENCOUNTER — Encounter: Payer: Self-pay | Admitting: Obstetrics & Gynecology

## 2012-02-13 VITALS — BP 128/87 | HR 73 | Ht 69.0 in | Wt 265.0 lb

## 2012-02-13 DIAGNOSIS — Z113 Encounter for screening for infections with a predominantly sexual mode of transmission: Secondary | ICD-10-CM

## 2012-02-13 DIAGNOSIS — N898 Other specified noninflammatory disorders of vagina: Secondary | ICD-10-CM

## 2012-02-13 DIAGNOSIS — Z0000 Encounter for general adult medical examination without abnormal findings: Secondary | ICD-10-CM

## 2012-02-13 DIAGNOSIS — Z23 Encounter for immunization: Secondary | ICD-10-CM

## 2012-02-13 DIAGNOSIS — Z124 Encounter for screening for malignant neoplasm of cervix: Secondary | ICD-10-CM

## 2012-02-13 DIAGNOSIS — Z1151 Encounter for screening for human papillomavirus (HPV): Secondary | ICD-10-CM

## 2012-02-13 NOTE — Progress Notes (Signed)
Subjective:    MESHA FUSSELMAN is a 31 y.o. female who presents for an annual exam. She complains of a vaginal discharge that is still present in spite of treatment for BV as well as OTC yeast medicine.  The patient is sexually active. GYN screening history: last pap: was normal. The patient wears seatbelts: yes. The patient participates in regular exercise: yes. Has the patient ever been transfused or tattooed?: yes. (tattoos) The patient reports that there is not domestic violence in her life.   Menstrual History: OB History    Grav Para Term Preterm Abortions TAB SAB Ect Mult Living   4 3 3  0 1  1   3       Menarche age: 65 Patient's last menstrual period was 02/06/2012.    The following portions of the patient's history were reviewed and updated as appropriate: allergies, current medications, past family history, past medical history, past social history, past surgical history and problem list.  Review of Systems A comprehensive review of systems was negative. She has lost weight recently by doing Weight [employer] and exercising. She generally has no dyspareunia. She will get her flu shot today.   Objective:    BP 128/87  Pulse 73  Ht 5\' 9"  (1.753 m)  Wt 265 lb (120.203 kg)  BMI 39.13 kg/m2  LMP 02/06/2012  General Appearance:    Alert, cooperative, no distress, appears stated age  Head:    Normocephalic, without obvious abnormality, atraumatic  Eyes:    PERRL, conjunctiva/corneas clear, EOM's intact, fundi    benign, both eyes  Ears:    Normal TM's and external ear canals, both ears  Nose:   Nares normal, septum midline, mucosa normal, no drainage    or sinus tenderness  Throat:   Lips, mucosa, and tongue normal; teeth and gums normal  Neck:   Supple, symmetrical, trachea midline, no adenopathy;    thyroid:  no enlargement/tenderness/nodules; no carotid   bruit or JVD  Back:     Symmetric, no curvature, ROM normal, no CVA tenderness  Lungs:     Clear to auscultation  bilaterally, respirations unlabored  Chest Wall:    No tenderness or deformity   Heart:    Regular rate and rhythm, S1 and S2 normal, no murmur, rub   or gallop  Breast Exam:    No tenderness, masses, or nipple abnormality  Abdomen:     Soft, non-tender, bowel sounds active all four quadrants,    no masses, no organomegaly, umbilical hernia  Genitalia:    Normal female without lesion, discharge or tenderness, small amount of white discharge, NSSA, NT, mobile, no adnexal tenderness or masses     Extremities:   Extremities normal, atraumatic, no cyanosis or edema  Pulses:   2+ and symmetric all extremities  Skin:   Skin color, texture, turgor normal, no rashes or lesions  Lymph nodes:   Cervical, supraclavicular, and axillary nodes normal  Neurologic:   CNII-XII intact, normal strength, sensation and reflexes    throughout  .    Assessment:    Healthy female exam.    Plan:     Thin prep Pap smear.  I will send a wet prep and cultures.

## 2012-02-13 NOTE — Progress Notes (Signed)
Patient is having increased irritating discharge yellow in color, no odor.

## 2012-02-14 ENCOUNTER — Telehealth: Payer: Self-pay | Admitting: *Deleted

## 2012-02-14 DIAGNOSIS — B9689 Other specified bacterial agents as the cause of diseases classified elsewhere: Secondary | ICD-10-CM

## 2012-02-14 LAB — WET PREP, GENITAL
Trich, Wet Prep: NONE SEEN
Yeast Wet Prep HPF POC: NONE SEEN

## 2012-02-14 MED ORDER — METRONIDAZOLE 500 MG PO TABS: 500.0000 mg | ORAL_TABLET | Freq: Two times a day (BID) | ORAL | Status: DC

## 2012-02-14 NOTE — Telephone Encounter (Signed)
Patient called for test results and we will call in flagyl for positive bv culture.

## 2012-06-06 ENCOUNTER — Telehealth: Payer: Self-pay

## 2012-06-06 NOTE — Telephone Encounter (Signed)
Patient called having symptoms of BV, we called in some Flagyl 500 mg to her pharmacy. Instructed to call us back and make an appointment if symptoms worsen or fail to improve.

## 2012-09-03 ENCOUNTER — Emergency Department: Payer: Self-pay | Admitting: Emergency Medicine

## 2012-09-03 LAB — CBC
HCT: 41.6 % (ref 35.0–47.0)
HGB: 13.8 g/dL (ref 12.0–16.0)
MCHC: 33.2 g/dL (ref 32.0–36.0)
MCV: 84 fL (ref 80–100)
Platelet: 210 10*3/uL (ref 150–440)
RDW: 13.8 % (ref 11.5–14.5)
WBC: 6.9 10*3/uL (ref 3.6–11.0)

## 2012-09-03 LAB — COMPREHENSIVE METABOLIC PANEL
Albumin: 3.3 g/dL — ABNORMAL LOW (ref 3.4–5.0)
Anion Gap: 2 — ABNORMAL LOW (ref 7–16)
Bilirubin,Total: 0.3 mg/dL (ref 0.2–1.0)
Calcium, Total: 8.4 mg/dL — ABNORMAL LOW (ref 8.5–10.1)
Chloride: 105 mmol/L (ref 98–107)
Co2: 31 mmol/L (ref 21–32)
Creatinine: 0.69 mg/dL (ref 0.60–1.30)
EGFR (African American): >60
EGFR (Non-African Amer.): >60
Osmolality: 273 (ref 275–301)
SGOT(AST): 40 U/L — ABNORMAL HIGH (ref 15–37)
SGPT (ALT): 43 U/L (ref 12–78)
Sodium: 138 mmol/L (ref 136–145)

## 2012-10-15 ENCOUNTER — Encounter: Payer: Self-pay | Admitting: Family Medicine

## 2012-10-15 ENCOUNTER — Ambulatory Visit (INDEPENDENT_AMBULATORY_CARE_PROVIDER_SITE_OTHER): Payer: BC Managed Care – PPO | Admitting: Family Medicine

## 2012-10-15 VITALS — BP 111/81 | HR 61 | Ht 69.0 in | Wt 262.0 lb

## 2012-10-15 DIAGNOSIS — K429 Umbilical hernia without obstruction or gangrene: Secondary | ICD-10-CM

## 2012-10-15 DIAGNOSIS — R102 Pelvic and perineal pain: Secondary | ICD-10-CM

## 2012-10-15 DIAGNOSIS — N949 Unspecified condition associated with female genital organs and menstrual cycle: Secondary | ICD-10-CM

## 2012-10-15 DIAGNOSIS — N63 Unspecified lump in unspecified breast: Secondary | ICD-10-CM

## 2012-10-15 NOTE — Patient Instructions (Signed)
Breast Cyst A breast cyst is a sac in your breast that is filled with fluid. This is common in women. Women can have one or many cysts. When the breasts contain many cysts, it is usually due to a noncancerous (benign) condition called fibrocystic change. These lumps form under the influence of female hormones (estrogen and progesterone). The lumps are most often located in the upper, outer portion of the breast. They are often more swollen, painful, and tender before your period starts. They usually disappear after menopause, unless you are on hormone therapy. Different types of cysts:  Macrocysts. These are cysts that are about 2 inches (5.1 cm) in diameter.  Microcysts. These are tiny cysts that you cannot feel, but that are seen with a mammogram or an ultrasound.  Galactocele. This is a cyst containing milk, which develops when and if you suddenly stop breastfeeding.  Sebaceous cyst of the skin (not in the breast tissue itself). These are not cancerous. Breast cysts do not increase your chance of getting breast cancer. However, they must be followed and treated closely, because a cyst can be cancerous. Be sure to see your caregiver for follow-up care as recommended.  CAUSES   It is not completely known what causes a breast cyst.  Estrogen may influence the development of a breast cyst.  An overgrowth of milk glands and connective tissue in the breast can block the milk glands, causing them to fill with fluid.  Scar tissue in the breast from previous surgery may block the glands, causing a cyst. SYMPTOMS   Feeling a smooth, round, soft lump (like a grape) in the breast that is easily moveable.  Breast discomfort or pain, especially in the area of the cyst.  Increase in size of the lump before your menstrual period, and decrease in size after your menstrual period. DIAGNOSIS   The cyst can be felt during an exam by your caregiver.  Mammogram (breast X-ray).  Ultrasound.  Removing  fluid from the cyst with a needle (fine needle aspiration). TREATMENT   Your caregiver may feel there is no reason for treatment. He or she may watch to see if it goes away on its own.  Hormone treatment.  Needle aspiration. There is a 40% chance of the cyst recurring after aspiration.  Surgery to remove the whole cyst. HOME CARE INSTRUCTIONS   Get a yearly exam by your caregiver.  Practice "breast self-awareness." This means understanding the normal appearance and feel of your breasts and may include breast self-examination.  Have a clinical breast exam (CBE) by a caregiver every 1 to 3 years if you are 20 to 33 years of age. After age 40, you should have a CBE every year.  Get mammogram tests as directed by your caregiver.  Only take over-the-counter pain medicine as directed by your caregiver.  Wear a good support bra, especially when exercising.  Avoid caffeine.  Reduce your salt intake, especially before your menstrual period. Too much salt can cause fluid retention, breast swelling, and discomfort. SEEK MEDICAL CARE IF:   You feel, or think you feel, a lump in your breast.  You notice that both breasts look different than usual.  You notice that both breasts feel different than before.  Your breast is still causing pain, after your menstrual period is over.  You need medicine for breast pain and swelling that occurs with your menstrual period. SEEK IMMEDIATE MEDICAL CARE IF:   You develop severe pain, tenderness, redness, or warmth in your   breast.  You develop nipple discharge or bleeding.  Your breast lump becomes hard and painful.  You find new lumps or bumps that were not there before.  You feel lumps in your armpit (axilla).  You notice dimpling or wrinkling of the breast or nipple.  You have a fever. Document Released: 05/01/2005 Document Revised: 07/24/2011 Document Reviewed: 08/21/2008 [facility] Patient Information 2014 [place], [place]. Umbilical  Herniorrhaphy Herniorrhaphy is surgery to repair a hernia. A hernia is the protrusion of a part of an organ through an abdominal opening. An umbilical hernia means that your hernia is in the area around your navel. If the hernia is not repaired, the gap could get bigger. Your intestines or other tissues, such as fat, could get trapped in the gap. This can lead to other health problems, such as blocked intestines. If the hernia is fixed before problems set in, you may be allowed to go home the same day as the surgery (outpatient). LET YOUR CAREGIVER KNOW ABOUT:  Allergies to food or medicine.  Medicines taken, including vitamins, herbs, eyedrops, over-the-counter medicines, and creams.  Use of steroids (by mouth or creams).  Previous problems with anesthetics or numbing medicines.  History of bleeding problems or blood clots.  Previous surgery.  Other health problems, including diabetes and kidney problems.  Possibility of pregnancy, if this applies. RISKS AND COMPLICATIONS  Pain.  Excessive bleeding.  Hematoma. This is a pocket of blood that collects under the surgery site.  Infection at the surgery site.  Numbness at the surgery site.  Swelling and bruising.  Blood clots.  Intestinal damage (rare).  Scarring.  Skin damage.  Development of another hernia. This may require another surgery. BEFORE THE PROCEDURE  Ask your caregiver about changing or stopping your regular medicines. You may need to stop taking aspirin, nonsteroidal anti-inflammatory drugs (NSAIDs), vitamin E, and blood thinners as early as 2 weeks before the procedure.  Do not eat or drink for 8 hours before the procedure, or as directed by your caregiver.  You might be asked to shower or wash with an antibacterial soap before the procedure.  Wear comfortable clothes that will be easy to put on after the procedure. PROCEDURE You will be given an intravenous (IV) tube. A needle will be inserted in your  arm. Medicine will flow directly into your body through this needle. You might be given medicine to help you relax (sedative). You will be given medicine that numbs the area (local anesthetic) or medicine that makes you sleep (general anesthetic). If you have open surgery:  The surgeon will make a cut (incision) in your abdomen.  The gap in the muscle wall will be repaired. The surgeon may sew the edges together over the gap or use a mesh material to strengthen the area. When mesh is used, the body grows new, strong tissue into and around it. This new tissue closes the gap.  A drain might be put in to remove excess fluid from the body after surgery.  The surgeon will close the incision with stitches, glue, or staples. If you have laparoscopic surgery:  The surgeon will make several small incisions in your abdomen.  A thin, lighted tube (laparoscope) will be inserted into the abdomen through an incision. A camera is attached to the laparoscope that allows the surgeon to see inside the abdomen.  Tools will be inserted through the other incisions to repair the hernia. Usually, mesh is used to cover the gap.  The surgeon will close the  incisions with stitches. AFTER THE PROCEDURE  You will be taken to a recovery area. A nurse will watch and check your progress.  When you are awake, feeling well, and taking fluids well, you may be allowed to go home. In some cases, you may need to stay overnight in the hospital.  Arrange for someone to drive you home. Document Released: 07/28/2008 Document Revised: 10/31/2011 Document Reviewed: 08/02/2011 [facility] Patient Information 2014 [place], [place].

## 2012-10-15 NOTE — Assessment & Plan Note (Signed)
Failed hernia repair in past--likely needs mesh--will refer to gen surg.

## 2012-10-15 NOTE — Progress Notes (Signed)
  Subjective:    Patient ID: Rhonda Rodgers, female    DOB: [birth year], 32 y.o.   MRN: [id number]  HPI  Ha lump in lower right area of breast x 4 wks.  Some surrounding erythema. Has not really changed much.  She has had similar issue in similar location, that was felt to be inclusion cyst.  That had previously resolved.   Has family h/o breast cancer and she is concerned about this. Having pain from umbilical hernia.  Has had repair x 1 with suture which quickly recurred.  Review of Systems  Constitutional: Negative for fever and chills.  Respiratory: Negative for shortness of breath.   Cardiovascular: Negative for chest pain.  Gastrointestinal: Negative for abdominal pain.  Genitourinary: Negative for pelvic pain.       Objective:   Physical Exam  Vitals reviewed. Constitutional: She appears well-developed and well-nourished.  HENT:  Head: Normocephalic and atraumatic.  Neck: Neck supple.  Cardiovascular: Normal rate.   Pulmonary/Chest: Effort normal. Right breast exhibits skin change. Right breast exhibits no inverted nipple and no mass. Left breast exhibits no inverted nipple and no mass.    Skin: She is not diaphoretic.          Assessment & Plan:

## 2012-10-15 NOTE — Assessment & Plan Note (Signed)
Hopefully just inclusion cyst.  Will get U/S.  May see gen surg, who may elect to remove this so it no longer causes her issue.

## 2012-10-15 NOTE — Progress Notes (Signed)
Has noticed a lump in her right breast.  This has been there for 4 weeks, she has had a cyst in the right breast before but it only lasted one week. Her mother has a history of breast cancer.  Last mammogram was one year ago to evaluate the cyst.

## 2012-10-17 ENCOUNTER — Other Ambulatory Visit: Payer: Self-pay | Admitting: General Practice

## 2012-10-17 DIAGNOSIS — N63 Unspecified lump in unspecified breast: Secondary | ICD-10-CM

## 2012-11-04 ENCOUNTER — Ambulatory Visit
Admission: RE | Admit: 2012-11-04 | Discharge: 2012-11-04 | Disposition: A | Discharge status: Home / Self Care | Payer: BC Managed Care – PPO | Source: Ambulatory Visit | Attending: Family Medicine | Admitting: Family Medicine

## 2012-11-04 DIAGNOSIS — N63 Unspecified lump in unspecified breast: Secondary | ICD-10-CM

## 2012-11-13 ENCOUNTER — Ambulatory Visit (INDEPENDENT_AMBULATORY_CARE_PROVIDER_SITE_OTHER): Payer: BC Managed Care – PPO | Admitting: General Surgery

## 2012-11-22 ENCOUNTER — Encounter (INDEPENDENT_AMBULATORY_CARE_PROVIDER_SITE_OTHER): Payer: Self-pay | Admitting: General Surgery

## 2012-11-22 ENCOUNTER — Telehealth (INDEPENDENT_AMBULATORY_CARE_PROVIDER_SITE_OTHER): Payer: Self-pay | Admitting: General Surgery

## 2012-11-22 ENCOUNTER — Ambulatory Visit (INDEPENDENT_AMBULATORY_CARE_PROVIDER_SITE_OTHER): Payer: BC Managed Care – PPO | Admitting: General Surgery

## 2012-11-22 VITALS — BP 126/78 | HR 82 | Resp 14 | Ht 69.0 in | Wt 266.8 lb

## 2012-11-22 DIAGNOSIS — K429 Umbilical hernia without obstruction or gangrene: Secondary | ICD-10-CM

## 2012-11-22 DIAGNOSIS — K432 Incisional hernia without obstruction or gangrene: Secondary | ICD-10-CM | POA: Insufficient documentation

## 2012-11-22 NOTE — Patient Instructions (Signed)
Hernia Repair with Laparoscope A hernia occurs when an internal organ pushes out through a weak spot in the belly (abdominal) wall muscles. Hernias most commonly occur in the groin and around the navel. Hernias can also occur through a cut by the surgeon (incision) after an abdominal operation. A hernia may be caused by:  Lifting heavy objects.  Prolonged coughing.  Straining to move your bowels. Hernias can often be pushed back into place (reduced). Most hernias tend to get worse over time. Problems occur when abdominal contents get stuck in the opening and the blood supply is blocked or impaired (incarcerated hernia). Because of these risks, you require surgery to repair the hernia. Your hernia will be repaired using a laparoscope. Laparoscopic surgery is a type of minimally invasive surgery. It does not involve making a typical surgical cut (incision) in the skin. A laparoscope is a telescope-like rod and lens system. It is usually connected to a video camera and a light source so your caregiver can clearly see the operative area. The instruments are inserted through  to  inch (5 mm or 10 mm) openings in the skin at specific locations. A working and viewing space is created by blowing a small amount of carbon dioxide gas into the abdominal cavity. The abdomen is essentially blown up like a balloon (insufflated). This elevates the abdominal wall above the internal organs like a dome. The carbon dioxide gas is common to the human body and can be absorbed by tissue and removed by the respiratory system. Once the repair is completed, the small incisions will be closed with either stitches (sutures) or staples (just like a paper stapler only this staple holds the skin together). LET YOUR CAREGIVERS KNOW ABOUT:  Allergies.  Medications taken including herbs, eye drops, over the counter medications, and creams.  Use of steroids (by mouth or creams).  Previous problems with anesthetics or  Novocaine.  Possibility of pregnancy, if this applies.  History of blood clots (thrombophlebitis).  History of bleeding or blood problems.  Previous surgery.  Other health problems. BEFORE THE PROCEDURE  Laparoscopy can be done either in a hospital or out-patient clinic. You may be given a mild sedative to help you relax before the procedure. Once in the operating room, you will be given a general anesthesia to make you sleep (unless you and your caregiver choose a different anesthetic).  AFTER THE PROCEDURE  After the procedure you will be watched in a recovery area. Depending on what type of hernia was repaired, you might be admitted to the hospital or you might go home the same day. With this procedure you may have less pain and scarring. This usually results in a quicker recovery and less risk of infection. HOME CARE INSTRUCTIONS   Bed rest is not required. You may continue your normal activities but avoid heavy lifting (more than 10 pounds) or straining.  Cough gently. If you are a smoker it is best to stop, as even the best hernia repair can break down with the continual strain of coughing.  Avoid driving until given the OK by your surgeon.  There are no dietary restrictions unless given otherwise.  TAKE ALL MEDICATIONS AS DIRECTED.  Only take over-the-counter or prescription medicines for pain, discomfort, or fever as directed by your caregiver. SEEK MEDICAL CARE IF:   There is increasing abdominal pain or pain in your incisions.  There is more bleeding from incisions, other than minimal spotting.  You feel light headed or faint.  You   develop an unexplained fever, chills, and/or an oral temperature above 102 F (38.9 C).  You have redness, swelling, or increasing pain in the wound.  Pus coming from wound.  A foul smell coming from the wound or dressings. SEEK IMMEDIATE MEDICAL CARE IF:   You develop a rash.  You have difficulty breathing.  You have any  allergic problems. MAKE SURE YOU:   Understand these instructions.  Will watch your condition.  Will get help right away if you are not doing well or get worse. Document Released: 05/01/2005 Document Revised: 07/24/2011 Document Reviewed: 03/31/2009 [employer] Patient Information 2014 [employer]  

## 2012-11-22 NOTE — Telephone Encounter (Signed)
refaxed medial release asking for op note from umb hernia surgery 2 yrs ago to be mailed with CT disc as well

## 2012-11-22 NOTE — Progress Notes (Signed)
Patient ID: Rhonda Rodgers, female   DOB: 03/09/1981, 32 y.o.   MRN: [id number]  Chief Complaint  Patient presents with  . New Evaluation    eval umb hernia    HPI Rhonda Rodgers is a 32 y.o. female.   HPI 32-year-old obese Caucasian female referred by Dr. Pratt for evaluation of recurrent umbilical hernia. The patient states that she underwent open umbilical hernia repair without mesh about 2 years ago at a facility across the street from [facility]. She states at that time the surgeon recommended mesh repair however she declined mesh insertion. She developed an early recurrence. She denies any fever, chills, nausea, vomiting, diarrhea or constipation. She did have one episode of nausea vomiting and periumbilical pain about 2 months ago which prompted her to go to the emergency room at  for labs and imaging was performed. She states that it showed a recurrent hernia. I do not have these records. Currently the area bothers her when she tries to do any physical activity. She's been trying to do physical activity in order to lose weight. She's lost about 14 pounds. When she does sit-ups or crunches or any type of heavy lifting she'll have discomfort in the area. She denies ever having a hard bulge in the area. She is currently in school.  Past Medical History  Diagnosis Date  . Asthma     yrs ago  . 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 Pelvic pain in female     Past Surgical History  Procedure Laterality Date  . Cholecystectomy    . Herina excision  09/2010  . Umbilical hernia repair    . Tubal ligation    . Laproscopy  12/2010    pelvic pain  . Wisdom tooth extraction    . Laparoscopy  01/09/2011    Procedure: LAPAROSCOPY DIAGNOSTIC;  Surgeon: Tanya S Pratt, MD;  Location: [facility];  Service: Gynecology;  Laterality: N/A;  . Embolization  02/28/2011    pelvic congestion syndrome    Family History    Problem Relation Age of Onset  . Diabetes Mother   . Cancer Mother 52    breast  . Hypertension Father   . Cancer Maternal Uncle 51    liver / pancreatic  . Cancer Paternal Grandmother     lung  . Cancer Paternal Grandfather     Social History History  Substance Use Topics  . Smoking status: Never Smoker   . Smokeless tobacco: Never Used  . Alcohol Use: No    Allergies  Allergen Reactions  . Contrast Media (Iodinated Diagnostic Agents) Nausea And Vomiting    Treated with IV benadryl. Recommend premeds prior to all contrast in future.  This was second event for patient.     Current Outpatient Prescriptions  Medication Sig Dispense Refill  . acetaminophen (TYLENOL) 500 MG tablet Take 500 mg by mouth as needed. Pt. Takes 1-2 tablest per day in between ibuprofen as needed for pain.       . doxycycline (ORACEA) 40 MG capsule Take 40 mg by mouth every morning.      . ibuprofen (ADVIL,MOTRIN) 800 MG tablet Take 800 mg by mouth every 8 (eight) hours as needed.         No current facility-administered medications for this visit.    Review of Systems Review of Systems  Constitutional: Negative for fever, chills and unexpected weight change.    HENT: Negative for hearing loss, congestion, sore throat, trouble swallowing and voice change.   Eyes: Negative for visual disturbance.  Respiratory: Negative for cough and wheezing.   Cardiovascular: Negative for chest pain, palpitations and leg swelling.  Gastrointestinal: Positive for abdominal pain. Negative for nausea, vomiting, diarrhea, constipation, blood in stool, abdominal distention and anal bleeding.  Genitourinary: Negative for hematuria, vaginal bleeding and difficulty urinating.  Musculoskeletal: Negative for arthralgias.  Skin: Negative for rash and wound.  Neurological: Negative for seizures, syncope and headaches.  Hematological: Negative for adenopathy. Does not bruise/bleed easily.  Psychiatric/Behavioral: Negative for  confusion.    Blood pressure 126/78, pulse 82, resp. rate 14, height 5' 9" (1.753 m), weight 266 lb 12.8 oz (121.02 kg).  Physical Exam Physical Exam  Vitals reviewed. Constitutional: She is oriented to person, place, and time. She appears well-developed and well-nourished. No distress.  obese  HENT:  Head: Normocephalic and atraumatic.  Right Ear: External ear normal.  Left Ear: External ear normal.  Eyes: Conjunctivae are normal. No scleral icterus.  Neck: Normal range of motion. Neck supple. No tracheal deviation present. No thyromegaly present.  Cardiovascular: Normal rate and normal heart sounds.   Pulmonary/Chest: Effort normal and breath sounds normal. No stridor. No respiratory distress. She has no wheezes.  Abdominal: She exhibits no distension. There is no rebound and no guarding. A hernia is present. Hernia confirmed positive in the ventral area.    Old small transverse incision at umbilicus. Redundant skin at umbilicus. +fascial defect at base of defect - 2-3cm?; difficult to determine exact dimensions given obesity. Mild TTP on deep palaption.   Musculoskeletal: She exhibits no edema and no tenderness.  Lymphadenopathy:    She has no cervical adenopathy.  Neurological: She is alert and oriented to person, place, and time. She exhibits normal muscle tone.  Skin: Skin is warm and dry. No rash noted. She is not diaphoretic. No erythema.  Psychiatric: She has a normal mood and affect. Her behavior is normal. Judgment and thought content normal.    Data Reviewed Most recent note from Dr Pratt Dr Pratt op note  Data Requested Hernia op note Imaging from [facility]  Assessment    Recurrent umbilical hernia Obesity BMI 39.4    Plan    We discussed the etiology of ventral/umbilical hernias. We discussed the signs and symptoms of incarceration and strangulation. The patient was given educational material. I also drew diagrams.  We discussed nonoperative and operative  management. With respect to operative management, we discussed both open repair and laparoscopic repair. We discussed the pros and cons of each approach. I discussed the typical aftercare with each procedure and how each procedure differs.  The patient is interested in reapproximating her abdominal wall muscles at the time of surgery. Therefore I propose doing primarily open repair in order to bring her abdominal wall muscles back together with the mesh underlay and performing a diagnostic laparoscopy to make sure that the mesh is flat with possible abdominal wall tacks being placed.  The patient has elected to Proceed with Laparoscopic assisted repair of recurrent umbilical hernia with mesh  We discussed the risk and benefits of surgery including but not limited to bleeding, infection, injury to surrounding structures, hernia recurrence, mesh complications, hematoma/seroma formation, need to convert to an open procedure, blood clot formation, urinary retention, post operative ileus, general anesthesia risk, long-term abdominal pain. We discussed that this procedure can be quite uncomfortable and difficult to recover from based on how the mesh   is secured to the abdominal wall. We discussed the importance of avoiding heavy lifting and straining for a period of 6 weeks.  I did advise the patient that she is at increased risk for recurrence because of her underlying obesity. Unfortunately I believe we'll need to proceed with surgery as opposed to allowing the patient to lose additional weight prior surgery due to the discomfort she is experiencing every time she tries to exercise  All of her questions were asked and answered  Gustie Bobb M. Torrance Frech, MD, FACS General, Bariatric, & Minimally Invasive Surgery [facility]          Meredith Kilbride M 11/22/2012, 11:37 AM    

## 2012-11-22 NOTE — Telephone Encounter (Signed)
Faxed med release of records to [facility] [facility]og CT scan Disc be mailed to [facility] 7/11 @10 :22

## 2012-11-26 ENCOUNTER — Encounter (INDEPENDENT_AMBULATORY_CARE_PROVIDER_SITE_OTHER): Payer: Self-pay

## 2012-11-27 ENCOUNTER — Encounter (HOSPITAL_COMMUNITY): Payer: Self-pay | Admitting: Pharmacy Technician

## 2012-11-27 ENCOUNTER — Other Ambulatory Visit (HOSPITAL_COMMUNITY): Payer: Self-pay | Admitting: *Deleted

## 2012-11-28 ENCOUNTER — Encounter (HOSPITAL_COMMUNITY): Payer: Self-pay

## 2012-11-28 ENCOUNTER — Encounter (HOSPITAL_COMMUNITY)
Admission: RE | Admit: 2012-11-28 | Discharge: 2012-11-28 | Disposition: A | Discharge status: Home / Self Care | Payer: BC Managed Care – PPO | Source: Ambulatory Visit | Attending: General Surgery | Admitting: General Surgery

## 2012-11-28 DIAGNOSIS — K429 Umbilical hernia without obstruction or gangrene: Secondary | ICD-10-CM | POA: Insufficient documentation

## 2012-11-28 DIAGNOSIS — Z01812 Encounter for preprocedural laboratory examination: Secondary | ICD-10-CM | POA: Insufficient documentation

## 2012-11-28 HISTORY — DX: Umbilical hernia without obstruction or gangrene: K42.9

## 2012-11-28 LAB — CBC WITH DIFFERENTIAL/PLATELET
Basophils Absolute: 0 10*3/uL (ref 0.0–0.1)
Eosinophils Absolute: 0.2 10*3/uL (ref 0.0–0.7)
Eosinophils Relative: 2 % (ref 0–5)
Lymphocytes Relative: 37 % (ref 12–46)
Lymphs Abs: 2.4 10*3/uL (ref 0.7–4.0)
MCV: 85.4 fL (ref 78.0–100.0)
Neutrophils Relative %: 55 % (ref 43–77)
Platelets: 221 10*3/uL (ref 150–400)
RBC: 4.66 MIL/uL (ref 3.87–5.11)
RDW: 13.8 % (ref 11.5–15.5)
WBC: 6.4 10*3/uL (ref 4.0–10.5)

## 2012-11-28 LAB — BASIC METABOLIC PANEL
Calcium: 9.5 mg/dL (ref 8.4–10.5)
Creatinine, Ser: 0.7 mg/dL (ref 0.50–1.10)
GFR calc non Af Amer: >90 mL/min (ref >90)
Sodium: 139 mEq/L (ref 135–145)

## 2012-11-28 LAB — HCG, SERUM, QUALITATIVE: Preg, Serum: NEGATIVE

## 2012-11-28 NOTE — Patient Instructions (Signed)
Rhonda Rodgers  11/28/2012                           YOUR PROCEDURE IS SCHEDULED ON:  12/09/12               PLEASE REPORT TO SHORT STAY CENTER AT :  7:30 am               CALL THIS NUMBER IF ANY PROBLEMS THE DAY OF SURGERY :               [phone]                      REMEMBER:   Do not eat food or drink liquids AFTER MIDNIGHT  Take these medicines the morning of surgery with A SIP OF WATER: none   Do not wear jewelry, make-up   Do not wear lotions, powders, or perfumes.   Do not shave legs or underarms 12 hrs. before surgery (men may shave face)  Do not bring valuables to the hospital.  Contacts, dentures or bridgework may not be worn into surgery.  Leave suitcase in the car. After surgery it may be brought to your room.  For patients admitted to the hospital more than one night, checkout time is 11:00                          The day of discharge.   Patients discharged the day of surgery will not be allowed to drive home                             If going home same day of surgery, must have someone stay with you first                           24 hrs at home and arrange for some one to drive you home from hospital.    Special Instructions:   Please read over the following fact sheets that you were given:                             2. [name] PREPARING FOR SURGERY SHEET                                                X_____________________________________________________________________        Failure to follow these instructions may result in cancellation of your surgery

## 2012-12-03 ENCOUNTER — Telehealth (INDEPENDENT_AMBULATORY_CARE_PROVIDER_SITE_OTHER): Payer: Self-pay | Admitting: General Surgery

## 2012-12-03 NOTE — Telephone Encounter (Signed)
Called patient to discuss her CT scan that she had done at outside facility now that it had time to review it. Her recurrent umbilical hernia measures 4 x 3 cm. We discussed the pros and cons of an open versus laparoscopic repair. We discussed the need for drain placement as well as increased risk for surgical site wound infection with an open repair. We discussed the typical repair of the hernia in a laparoscopic fashion. We did discuss the chance of postoperative seroma formation. We did discuss the possibility of overnight hospitalization for pain control. Otherwise the risk of surgery are the same from our initial conversation in the office. The patient is in agreement to proceed with a straight laparoscopic repair of recurrent incisional umbilical hernia

## 2012-12-09 ENCOUNTER — Encounter (HOSPITAL_COMMUNITY)
Admission: RE | Disposition: A | Discharge status: Home / Self Care | Payer: Self-pay | Source: Ambulatory Visit | Attending: General Surgery

## 2012-12-09 ENCOUNTER — Encounter (HOSPITAL_COMMUNITY): Payer: Self-pay | Admitting: *Deleted

## 2012-12-09 ENCOUNTER — Inpatient Hospital Stay (HOSPITAL_COMMUNITY)
Admission: RE | Admit: 2012-12-09 | Discharge: 2012-12-11 | DRG: 160 | Disposition: A | Discharge status: Home / Self Care | Payer: BC Managed Care – PPO | Source: Ambulatory Visit | Attending: General Surgery | Admitting: General Surgery

## 2012-12-09 ENCOUNTER — Ambulatory Visit (HOSPITAL_COMMUNITY): Payer: BC Managed Care – PPO | Admitting: *Deleted

## 2012-12-09 DIAGNOSIS — K432 Incisional hernia without obstruction or gangrene: Principal | ICD-10-CM | POA: Diagnosis present

## 2012-12-09 DIAGNOSIS — Z6839 Body mass index (BMI) 39.0-39.9, adult: Secondary | ICD-10-CM

## 2012-12-09 DIAGNOSIS — Z01812 Encounter for preprocedural laboratory examination: Secondary | ICD-10-CM

## 2012-12-09 DIAGNOSIS — K429 Umbilical hernia without obstruction or gangrene: Secondary | ICD-10-CM

## 2012-12-09 DIAGNOSIS — E669 Obesity, unspecified: Secondary | ICD-10-CM | POA: Diagnosis present

## 2012-12-09 HISTORY — PX: INSERTION OF MESH: SHX5868

## 2012-12-09 HISTORY — PX: VENTRAL HERNIA REPAIR: SHX424

## 2012-12-09 SURGERY — INSERTION OF MESH
Anesthesia: General | Site: Abdomen | Wound class: Clean

## 2012-12-09 MED ORDER — PROPOFOL 10 MG/ML IV BOLUS
INTRAVENOUS | Status: DC | PRN
  Administered 2012-12-09: 200 mg via INTRAVENOUS

## 2012-12-09 MED ORDER — NEOSTIGMINE METHYLSULFATE 1 MG/ML IJ SOLN
INTRAMUSCULAR | Status: DC | PRN
  Administered 2012-12-09: 3 mg via INTRAVENOUS

## 2012-12-09 MED ORDER — MORPHINE SULFATE (PF) 1 MG/ML IV SOLN
INTRAVENOUS | Status: DC
  Administered 2012-12-09: 13:00:00 via INTRAVENOUS
  Administered 2012-12-09: 3 mg via INTRAVENOUS
  Administered 2012-12-10: 1.2 mg via INTRAVENOUS
  Administered 2012-12-10: 4.5 mg via INTRAVENOUS
  Administered 2012-12-10: 09:00:00 via INTRAVENOUS
  Administered 2012-12-10: 7.5 mg via INTRAVENOUS
  Administered 2012-12-10: 1.5 mg via INTRAVENOUS
  Administered 2012-12-10: 6 mg via INTRAVENOUS
  Administered 2012-12-10: 4.5 mg via INTRAVENOUS
  Administered 2012-12-11: 1.5 mg via INTRAVENOUS
  Administered 2012-12-11: 45 mg via INTRAVENOUS
  Administered 2012-12-11: 4.5 mg via INTRAVENOUS
  Filled 2012-12-09: qty 25

## 2012-12-09 MED ORDER — ENOXAPARIN SODIUM 40 MG/0.4ML ~~LOC~~ SOLN
40.0000 mg | SUBCUTANEOUS | Status: DC
  Administered 2012-12-10 – 2012-12-11 (×2): 40 mg via SUBCUTANEOUS
  Filled 2012-12-09 (×3): qty 0.4

## 2012-12-09 MED ORDER — BUPIVACAINE-EPINEPHRINE 0.25% -1:200000 IJ SOLN
INTRAMUSCULAR | Status: AC
  Filled 2012-12-09: qty 1

## 2012-12-09 MED ORDER — BUPIVACAINE 0.25 % ON-Q PUMP DUAL CATH 300 ML
300.0000 mL | INJECTION | Status: DC
  Administered 2012-12-09: 300 mL
  Filled 2012-12-09: qty 300

## 2012-12-09 MED ORDER — DEXTROSE 5 % IV SOLN
3.0000 g | INTRAVENOUS | Status: DC
  Filled 2012-12-09: qty 3000

## 2012-12-09 MED ORDER — GLYCOPYRROLATE 0.2 MG/ML IJ SOLN
INTRAMUSCULAR | Status: DC | PRN
  Administered 2012-12-09: 0.4 mg via INTRAVENOUS

## 2012-12-09 MED ORDER — FENTANYL CITRATE 0.05 MG/ML IJ SOLN: 25.0000 ug | INTRAMUSCULAR | Status: DC | PRN

## 2012-12-09 MED ORDER — CHLORHEXIDINE GLUCONATE 4 % EX LIQD
1.0000 "application " | Freq: Once | CUTANEOUS | Status: DC
  Filled 2012-12-09: qty 15

## 2012-12-09 MED ORDER — CEFAZOLIN SODIUM-DEXTROSE 2-3 GM-% IV SOLR
INTRAVENOUS | Status: AC
  Filled 2012-12-09: qty 50

## 2012-12-09 MED ORDER — MIDAZOLAM HCL 5 MG/5ML IJ SOLN
INTRAMUSCULAR | Status: DC | PRN
  Administered 2012-12-09 (×2): 1 mg via INTRAVENOUS

## 2012-12-09 MED ORDER — SODIUM CHLORIDE 0.9 % IJ SOLN: 9.0000 mL | INTRAMUSCULAR | Status: DC | PRN

## 2012-12-09 MED ORDER — FENTANYL CITRATE 0.05 MG/ML IJ SOLN
INTRAMUSCULAR | Status: DC | PRN
  Administered 2012-12-09 (×2): 50 ug via INTRAVENOUS
  Administered 2012-12-09: 100 ug via INTRAVENOUS
  Administered 2012-12-09: 50 ug via INTRAVENOUS

## 2012-12-09 MED ORDER — OXYCODONE-ACETAMINOPHEN 5-325 MG PO TABS
1.0000 | ORAL_TABLET | ORAL | Status: DC | PRN
  Administered 2012-12-10: 2 via ORAL
  Filled 2012-12-09: qty 2

## 2012-12-09 MED ORDER — BUPIVACAINE ON-Q PAIN PUMP (FOR ORDER SET NO CHG)
INJECTION | Status: DC
  Filled 2012-12-09: qty 1

## 2012-12-09 MED ORDER — MEPERIDINE HCL 50 MG/ML IJ SOLN: 6.2500 mg | INTRAMUSCULAR | Status: DC | PRN

## 2012-12-09 MED ORDER — SUCCINYLCHOLINE CHLORIDE 20 MG/ML IJ SOLN
INTRAMUSCULAR | Status: DC | PRN
  Administered 2012-12-09: 100 mg via INTRAVENOUS

## 2012-12-09 MED ORDER — LACTATED RINGERS IV SOLN
INTRAVENOUS | Status: DC
  Administered 2012-12-09: 11:00:00 via INTRAVENOUS
  Administered 2012-12-09: 1000 mL via INTRAVENOUS

## 2012-12-09 MED ORDER — BUPIVACAINE-EPINEPHRINE 0.25% -1:200000 IJ SOLN
INTRAMUSCULAR | Status: DC | PRN
  Administered 2012-12-09: 50 mL

## 2012-12-09 MED ORDER — KCL IN DEXTROSE-NACL 20-5-0.45 MEQ/L-%-% IV SOLN
INTRAVENOUS | Status: DC
  Administered 2012-12-09 – 2012-12-11 (×3): via INTRAVENOUS
  Filled 2012-12-09 (×4): qty 1000

## 2012-12-09 MED ORDER — LIDOCAINE HCL (CARDIAC) 20 MG/ML IV SOLN
INTRAVENOUS | Status: DC | PRN
  Administered 2012-12-09: 50 mg via INTRAVENOUS

## 2012-12-09 MED ORDER — ONDANSETRON HCL 4 MG/2ML IJ SOLN
4.0000 mg | Freq: Four times a day (QID) | INTRAMUSCULAR | Status: DC | PRN
  Administered 2012-12-09 – 2012-12-10 (×3): 4 mg via INTRAVENOUS
  Filled 2012-12-09 (×3): qty 2

## 2012-12-09 MED ORDER — PROMETHAZINE HCL 25 MG/ML IJ SOLN
6.2500 mg | INTRAMUSCULAR | Status: AC | PRN
  Administered 2012-12-09 (×2): 12.5 mg via INTRAVENOUS

## 2012-12-09 MED ORDER — NALOXONE HCL 0.4 MG/ML IJ SOLN: 0.4000 mg | INTRAMUSCULAR | Status: DC | PRN

## 2012-12-09 MED ORDER — MORPHINE SULFATE (PF) 1 MG/ML IV SOLN
INTRAVENOUS | Status: AC
  Filled 2012-12-09: qty 25

## 2012-12-09 MED ORDER — DIPHENHYDRAMINE HCL 12.5 MG/5ML PO ELIX: 12.5000 mg | ORAL_SOLUTION | Freq: Four times a day (QID) | ORAL | Status: DC | PRN

## 2012-12-09 MED ORDER — PANTOPRAZOLE SODIUM 40 MG IV SOLR
40.0000 mg | INTRAVENOUS | Status: DC
  Administered 2012-12-09 – 2012-12-10 (×2): 40 mg via INTRAVENOUS
  Filled 2012-12-09 (×3): qty 40

## 2012-12-09 MED ORDER — 0.9 % SODIUM CHLORIDE (POUR BTL) OPTIME
TOPICAL | Status: DC | PRN
  Administered 2012-12-09: 1000 mL

## 2012-12-09 MED ORDER — CEFAZOLIN SODIUM 1-5 GM-% IV SOLN
INTRAVENOUS | Status: AC
  Filled 2012-12-09: qty 50

## 2012-12-09 MED ORDER — LACTATED RINGERS IV SOLN
INTRAVENOUS | Status: DC
  Administered 2012-12-09: 12:00:00 via INTRAVENOUS

## 2012-12-09 MED ORDER — ONDANSETRON HCL 4 MG/2ML IJ SOLN
INTRAMUSCULAR | Status: DC | PRN
  Administered 2012-12-09: 4 mg via INTRAVENOUS

## 2012-12-09 MED ORDER — DEXTROSE 5 % IV SOLN
3.0000 g | INTRAVENOUS | Status: DC | PRN
  Administered 2012-12-09: 3 g via INTRAVENOUS

## 2012-12-09 MED ORDER — DIPHENHYDRAMINE HCL 50 MG/ML IJ SOLN: 12.5000 mg | Freq: Four times a day (QID) | INTRAMUSCULAR | Status: DC | PRN

## 2012-12-09 MED ORDER — ROCURONIUM BROMIDE 100 MG/10ML IV SOLN
INTRAVENOUS | Status: DC | PRN
  Administered 2012-12-09: 5 mg via INTRAVENOUS
  Administered 2012-12-09: 15 mg via INTRAVENOUS
  Administered 2012-12-09: 35 mg via INTRAVENOUS

## 2012-12-09 MED ORDER — PROMETHAZINE HCL 25 MG/ML IJ SOLN
INTRAMUSCULAR | Status: AC
  Filled 2012-12-09: qty 1

## 2012-12-09 SURGICAL SUPPLY — 57 items
APPLIER CLIP LOGIC TI 5 (MISCELLANEOUS) IMPLANT
BANDAGE ADHESIVE 1X3 (GAUZE/BANDAGES/DRESSINGS) IMPLANT
BENZOIN TINCTURE PRP APPL 2/3 (GAUZE/BANDAGES/DRESSINGS) IMPLANT
BINDER ABD UNIV 12 45-62 (WOUND CARE) ×3 IMPLANT
BINDER ABDOMINAL 46IN 62IN (WOUND CARE) ×4
CABLE HIGH FREQUENCY MONO STRZ (ELECTRODE) IMPLANT
CANISTER SUCTION 2500CC (MISCELLANEOUS) IMPLANT
CATH KIT ON-Q SILVERSOAK 7.5IN (CATHETERS) ×8 IMPLANT
CHLORAPREP W/TINT 26ML (MISCELLANEOUS) ×8 IMPLANT
CLOTH BEACON ORANGE TIMEOUT ST (SAFETY) ×4 IMPLANT
DECANTER SPIKE VIAL GLASS SM (MISCELLANEOUS) ×4 IMPLANT
DERMABOND ADVANCED (GAUZE/BANDAGES/DRESSINGS) ×1
DERMABOND ADVANCED .7 DNX12 (GAUZE/BANDAGES/DRESSINGS) ×3 IMPLANT
DEVICE SECURE STRAP 25 ABSORB (INSTRUMENTS) ×8 IMPLANT
DEVICE SUTURE ENDOST 10MM (ENDOMECHANICALS) ×4 IMPLANT
DRAPE INCISE IOBAN 66X45 STRL (DRAPES) ×4 IMPLANT
DRAPE LAPAROSCOPIC ABDOMINAL (DRAPES) ×4 IMPLANT
DRAPE UTILITY XL STRL (DRAPES) ×4 IMPLANT
ELECT REM PT RETURN 9FT ADLT (ELECTROSURGICAL) ×4
ELECTRODE REM PT RTRN 9FT ADLT (ELECTROSURGICAL) ×3 IMPLANT
GLOVE BIO SURGEON STRL SZ7.5 (GLOVE) ×4 IMPLANT
GLOVE BIOGEL M STRL SZ7.5 (GLOVE) IMPLANT
GLOVE BIOGEL PI IND STRL 7.0 (GLOVE) ×3 IMPLANT
GLOVE BIOGEL PI INDICATOR 7.0 (GLOVE) ×1
GLOVE INDICATOR 8.0 STRL GRN (GLOVE) ×4 IMPLANT
GOWN STRL NON-REIN LRG LVL3 (GOWN DISPOSABLE) IMPLANT
GOWN STRL REIN XL XLG (GOWN DISPOSABLE) ×12 IMPLANT
HOVERMATT SINGLE USE (MISCELLANEOUS) ×4 IMPLANT
KIT BASIN OR (CUSTOM PROCEDURE TRAY) ×4 IMPLANT
MARKER SKIN DUAL TIP RULER LAB (MISCELLANEOUS) ×4 IMPLANT
MESH VENTRALIGHT ST 6IN CRC (Mesh General) ×4 IMPLANT
NEEDLE INSUFFLATION 14GA 120MM (NEEDLE) IMPLANT
NEEDLE SPNL 22GX3.5 QUINCKE BK (NEEDLE) ×4 IMPLANT
NS IRRIG 1000ML POUR BTL (IV SOLUTION) ×4 IMPLANT
SCALPEL HARMONIC ACE (MISCELLANEOUS) IMPLANT
SCISSORS LAP 5X35 DISP (ENDOMECHANICALS) IMPLANT
SET IRRIG TUBING LAPAROSCOPIC (IRRIGATION / IRRIGATOR) IMPLANT
SLEEVE XCEL OPT CAN 5 100 (ENDOMECHANICALS) ×4 IMPLANT
SOLUTION ANTI FOG 6CC (MISCELLANEOUS) ×4 IMPLANT
STAPLER VISISTAT 35W (STAPLE) IMPLANT
STRIP CLOSURE SKIN 1/2X4 (GAUZE/BANDAGES/DRESSINGS) IMPLANT
SUT MNCRL AB 4-0 PS2 18 (SUTURE) ×4 IMPLANT
SUT NOVA NAB DX-16 0-1 5-0 T12 (SUTURE) IMPLANT
SUT NOVA NAB GS-21 0 18 T12 DT (SUTURE) ×8 IMPLANT
SUT PROLENE 1 CT 1 30 (SUTURE) IMPLANT
SUT PROLENE 2 0 CT 1 CR (SUTURE) IMPLANT
SUT PROLENE 2 0 SH DA (SUTURE) IMPLANT
SUT SURGIDAC NAB ES-9 0 48 120 (SUTURE) ×20 IMPLANT
TACKER 5MM HERNIA 3.5CML NAB (ENDOMECHANICALS) IMPLANT
TOWEL OR 17X26 10 PK STRL BLUE (TOWEL DISPOSABLE) ×4 IMPLANT
TRAY FOLEY CATH 14FRSI W/METER (CATHETERS) ×4 IMPLANT
TRAY LAP CHOLE (CUSTOM PROCEDURE TRAY) ×4 IMPLANT
TROCAR BLADELESS OPT 5 75 (ENDOMECHANICALS) ×4 IMPLANT
TROCAR XCEL BLUNT TIP 100MML (ENDOMECHANICALS) IMPLANT
TROCAR XCEL NON-BLD 11X100MML (ENDOMECHANICALS) ×4 IMPLANT
TUBING INSUFFLATION 10FT LAP (TUBING) ×4 IMPLANT
TUNNELER SHEATH ON-Q 16GX12 DP (PAIN MANAGEMENT) ×4 IMPLANT

## 2012-12-09 NOTE — Interval H&P Note (Signed)
History and Physical Interval Note:  12/09/2012 9:27 AM  Ernst Bowler  has presented today for surgery, with the diagnosis of hernia  The various methods of treatment have been discussed with the patient and family. After consideration of risks, benefits and other options for treatment, the patient has consented to  Procedure(s): LAPAROSCOPIC UMBILICAL HERNIA (N/A) INSERTION OF MESH (N/A) as a surgical intervention .  The patient's history has been reviewed, patient examined, no change in status, stable for surgery.  I have reviewed the patient's chart and labs.  Questions were answered to the patient's satisfaction.    Discussed possibility of ON-Q pain pump insertion. Pt in agreement.   Mary Sella. Andrey Campanile, MD, FACS General, Bariatric, & Minimally Invasive Surgery [facility], [place]   [facility] M

## 2012-12-09 NOTE — H&P (View-Only) (Signed)
Patient ID: Rhonda Rodgers, female   DOB: July 12, 1980, 32 y.o.   MRN: [id number]  Chief Complaint  Patient presents with  . New Evaluation    eval umb hernia    HPI Rhonda Rodgers is a 32 y.o. female.   HPI 32 year old obese Caucasian female referred by Dr. Shawnie Pons for evaluation of recurrent umbilical hernia. The patient states that she underwent open umbilical hernia repair without mesh about 2 years ago at a facility across the street from [facility]. She states at that time the surgeon recommended mesh repair however she declined mesh insertion. She developed an early recurrence. She denies any fever, chills, nausea, vomiting, diarrhea or constipation. She did have one episode of nausea vomiting and periumbilical pain about 2 months ago which prompted her to go to the emergency room at [facility] for labs and imaging was performed. She states that it showed a recurrent hernia. I do not have these records. Currently the area bothers her when she tries to do any physical activity. She's been trying to do physical activity in order to lose weight. She's lost about 14 pounds. When she does sit-ups or crunches or any type of heavy lifting she'll have discomfort in the area. She denies ever having a hard bulge in the area. She is currently in school.  Past Medical History  Diagnosis Date  . Asthma     yrs ago  . 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 Pelvic pain in female     Past Surgical History  Procedure Laterality Date  . Cholecystectomy    . Herina excision  09/2010  . Umbilical hernia repair    . Tubal ligation    . Laproscopy  12/2010    pelvic pain  . Wisdom tooth extraction    . Laparoscopy  01/09/2011    Procedure: LAPAROSCOPY DIAGNOSTIC;  Surgeon: Reva Bores, MD;  Location: [facility];  Service: Gynecology;  Laterality: N/A;  . Embolization  02/28/2011    pelvic congestion syndrome    Family History    Problem Relation Age of Onset  . Diabetes Mother   . Cancer Mother 13    breast  . Hypertension Father   . Cancer Maternal Uncle 51    liver / pancreatic  . Cancer Paternal Grandmother     lung  . Cancer Paternal Grandfather     Social History History  Substance Use Topics  . Smoking status: Never Smoker   . Smokeless tobacco: Never Used  . Alcohol Use: No    Allergies  Allergen Reactions  . Contrast Media (Iodinated Diagnostic Agents) Nausea And Vomiting    Treated with IV benadryl. Recommend premeds prior to all contrast in future.  This was second event for patient.     Current Outpatient Prescriptions  Medication Sig Dispense Refill  . acetaminophen (TYLENOL) 500 MG tablet Take 500 mg by mouth as needed. Pt. Takes 1-2 tablest per day in between ibuprofen as needed for pain.       Marland Kitchen doxycycline (ORACEA) 40 MG capsule Take 40 mg by mouth every morning.      Marland Kitchen ibuprofen (ADVIL,MOTRIN) 800 MG tablet Take 800 mg by mouth every 8 (eight) hours as needed.         No current facility-administered medications for this visit.    Review of Systems Review of Systems  Constitutional: Negative for fever, chills and unexpected weight change.  HENT: Negative for hearing loss, congestion, sore throat, trouble swallowing and voice change.   Eyes: Negative for visual disturbance.  Respiratory: Negative for cough and wheezing.   Cardiovascular: Negative for chest pain, palpitations and leg swelling.  Gastrointestinal: Positive for abdominal pain. Negative for nausea, vomiting, diarrhea, constipation, blood in stool, abdominal distention and anal bleeding.  Genitourinary: Negative for hematuria, vaginal bleeding and difficulty urinating.  Musculoskeletal: Negative for arthralgias.  Skin: Negative for rash and wound.  Neurological: Negative for seizures, syncope and headaches.  Hematological: Negative for adenopathy. Does not bruise/bleed easily.  Psychiatric/Behavioral: Negative for  confusion.    Blood pressure 126/78, pulse 82, resp. rate 14, height 5\' 9"  (1.753 m), weight 266 lb 12.8 oz (121.02 kg).  Physical Exam Physical Exam  Vitals reviewed. Constitutional: She is oriented to person, place, and time. She appears well-developed and well-nourished. No distress.  obese  HENT:  Head: Normocephalic and atraumatic.  Right Ear: External ear normal.  Left Ear: External ear normal.  Eyes: Conjunctivae are normal. No scleral icterus.  Neck: Normal range of motion. Neck supple. No tracheal deviation present. No thyromegaly present.  Cardiovascular: Normal rate and normal heart sounds.   Pulmonary/Chest: Effort normal and breath sounds normal. No stridor. No respiratory distress. She has no wheezes.  Abdominal: She exhibits no distension. There is no rebound and no guarding. A hernia is present. Hernia confirmed positive in the ventral area.    Old small transverse incision at umbilicus. Redundant skin at umbilicus. +fascial defect at base of defect - 2-3cm?; difficult to determine exact dimensions given obesity. Mild TTP on deep palaption.   Musculoskeletal: She exhibits no edema and no tenderness.  Lymphadenopathy:    She has no cervical adenopathy.  Neurological: She is alert and oriented to person, place, and time. She exhibits normal muscle tone.  Skin: Skin is warm and dry. No rash noted. She is not diaphoretic. No erythema.  Psychiatric: She has a normal mood and affect. Her behavior is normal. Judgment and thought content normal.    Data Reviewed Most recent note from Dr Shawnie Pons Dr Shawnie Pons op note  Data Requested Hernia op note Imaging from [facility]  Assessment    Recurrent umbilical hernia Obesity BMI 39.4    Plan    We discussed the etiology of ventral/umbilical hernias. We discussed the signs and symptoms of incarceration and strangulation. The patient was given educational material. I also drew diagrams.  We discussed nonoperative and operative  management. With respect to operative management, we discussed both open repair and laparoscopic repair. We discussed the pros and cons of each approach. I discussed the typical aftercare with each procedure and how each procedure differs.  The patient is interested in reapproximating her abdominal wall muscles at the time of surgery. Therefore I propose doing primarily open repair in order to bring her abdominal wall muscles back together with the mesh underlay and performing a diagnostic laparoscopy to make sure that the mesh is flat with possible abdominal wall tacks being placed.  The patient has elected to Proceed with Laparoscopic assisted repair of recurrent umbilical hernia with mesh  We discussed the risk and benefits of surgery including but not limited to bleeding, infection, injury to surrounding structures, hernia recurrence, mesh complications, hematoma/seroma formation, need to convert to an open procedure, blood clot formation, urinary retention, post operative ileus, general anesthesia risk, long-term abdominal pain. We discussed that this procedure can be quite uncomfortable and difficult to recover from based on how the mesh  is secured to the abdominal wall. We discussed the importance of avoiding heavy lifting and straining for a period of 6 weeks.  I did advise the patient that she is at increased risk for recurrence because of her underlying obesity. Unfortunately I believe we'll need to proceed with surgery as opposed to allowing the patient to lose additional weight prior surgery due to the discomfort she is experiencing every time she tries to exercise  All of her questions were asked and answered  Mary Sella. Andrey Campanile, MD, FACS General, Bariatric, & Minimally Invasive Surgery [facility], [place]          [facility] M 11/22/2012, 11:37 AM

## 2012-12-09 NOTE — Anesthesia Postprocedure Evaluation (Signed)
  Anesthesia Post-op Note  Patient: Rhonda Rodgers  Procedure(s) Performed: Procedure(s) (LRB): INSERTION OF MESH (N/A) LAPAROSCOPIC REPAIR OF RECURRENT INCISIONAL VENTRAL HERNIA WITH MESH  Patient Location: PACU  Anesthesia Type: General  Level of Consciousness: awake and alert   Airway and Oxygen Therapy: Patient Spontanous Breathing  Post-op Pain: mild  Post-op Assessment: Post-op Vital signs reviewed, Patient's Cardiovascular Status Stable, Respiratory Function Stable, Patent Airway and No signs of Nausea or vomiting  Last Vitals:  Filed Vitals:   12/09/12 1245  BP: 117/74  Pulse: 71  Temp: 36.4 C  Resp: 20    Post-op Vital Signs: stable   Complications: No apparent anesthesia complications

## 2012-12-09 NOTE — Transfer of Care (Signed)
Immediate Anesthesia Transfer of Care Note  Patient: Rhonda Rodgers  Procedure(s) Performed: Procedure(s): INSERTION OF MESH (N/A)  Patient Location: PACU  Anesthesia Type:General  Level of Consciousness: awake, alert  and oriented  Airway & Oxygen Therapy: Patient Spontanous Breathing and Patient connected to face mask oxygen  Post-op Assessment: Report given to PACU RN and Post -op Vital signs reviewed and stable  Post vital signs: Reviewed and stable  Complications: No apparent anesthesia complications

## 2012-12-09 NOTE — Op Note (Signed)
Laparoscopic Ventral Incisional Hernia Repair with Mesh & Placement of On-Q pain catheters Procedure Note  Indications: Symptomatic ventral recurrent incisional hernia. 32 year old obese Caucasian female underwent Open primary repair of an umbilical hernia A few years ago. Her hernia has recurred. It is interfering with her daily activities. It is preventing her from exercising due to pain. We discussed a laparoscopic Repair. We discussed the risk and benefits of the surgery including but not limited to bleeding, infection, injury to surrounding structures, hematoma formation, seroma formation, hernia recurrence, mesh complications, blood clot formation, ileus, anesthesia concerns, hernia recurrence and chronic abdominal pain. She elected to do surgery  Pre-operative Diagnosis: recurrent ventral incisional hernia  Post-operative Diagnosis: same  Surgeon: [name]   Assistants: none  Anesthesia: General endotracheal anesthesia  ASA Class: 3  Procedure Details  The patient was seen in the Holding Room. The risks, benefits, complications, treatment options, and expected outcomes were discussed with the patient. The possibilities of reaction to medication, pulmonary aspiration, perforation of viscus, bleeding, recurrent infection, the need for additional procedures, failure to diagnose a condition, and creating a complication requiring transfusion or operation were discussed with the patient. The patient concurred with the proposed plan, giving informed consent.  The site of surgery properly noted/marked. The patient was taken to the operating room, identified as Rhonda Rodgers and the procedure verified as laparoscopic ventral hernia repair with mesh. A Time Out was held and the above information confirmed.    The patient was placed supine.  After establishing general anesthesia, a Foley catheter was placed.  The abdomen was prepped with Chloraprep and draped in standard fashion with Ioban.   A 5 mm Optiview was used the cannulate the peritoneal cavity in the left upper quadrant below the costal margin.  Pneumoperitoneum was obtained by insufflating CO2, maintaining a maximum pressure of 15 mmHg.  The 5 mm 30-degree laparoscopic was inserted.  There was only a few omental adhesions to the anterior abdominal wall in and around the hernia defect.  An 11-mm port was placed in the left anterior axillary line at the level of the umbilicus.   Gentle traction was used to dissect the omental adhesions away from the anterior abdominal wall.  We cleared the entire abdominal wall and were able to visualize 1 fascial defects. We used a spinal needle to identify the extent of the hernia defects.  This covered an area of about 5 x 5 cms.  An additional 5cm was added circumferentially around the defect to give a 5cm mesh overlap. This resulted in needing a round 15cm piece of mesh. We selected a 15.2 cm piece of BardVentralightST mesh. The fascia was reapproximated with 4 interrupted 0 ethibond sutures using the Endostitch and knot pusher. We placed 8 stay sutures of 0 Novofil around the edges of the mesh.  The mesh was then rolled up and inserted through the 11 mm port site.  The mesh was then unrolled.  The stay sutures were then pulled up through small stab incisions using the Endo-close device.  This deployed the mesh widely over the fascial defects.  The stay sutures were then tied down.  The Secure Strap device was then used to tack down the edges of the mesh at 1 cm intervals circumferentially.  We placed a few tacks inside the outer ring of tacks.  We inspected for hemostasis.  Two 7cm On-Q pain pump catheters (one on each side of the abdomen) were placed in the preperitoneal space - the fascia  was not violated.   Pneumoperitoneum was then released as we removed the remainder of the trocars.  The port sites were closed with 4-0 Monocryl.  All of the incisions and stay suture sites were then sealed with  Dermabond.  An abdominal binder was placed around the patient's abdomen.  The patient was extubated and brought to the recovery room in stable condition.  All sponge, instrument, and needle counts were correct prior to closure and at the conclusion of the case.   Findings: 5x5cm defect; 5cm circumferential mesh overlap. 15.2cm Bard VentralightST mesh; reapproximated fascia prior to mesh placement.   Type of repair - mesh  (choices - primary suture, mesh, or component)  Name of mesh - BardVentralightST  Size of mesh - 15.2cm round Mesh overlap - 5 cm  Placement of mesh - beneath fascia and into peritoneal cavity  (choices - beneath fascia and into peritoneal cavity, beneath fascia but external to peritoneal cavity, between the muscle and fascia, above or external to fascia)  Estimated Blood Loss:  Minimal         Complications:  None; patient tolerated the procedure well.         Disposition: PACU - hemodynamically stable.         Condition: stable  Mary Sella. Andrey Campanile, MD, FACS General, Bariatric, & Minimally Invasive Surgery [facility], [place]

## 2012-12-09 NOTE — Anesthesia Preprocedure Evaluation (Signed)
Anesthesia Evaluation  Patient identified by MRN, date of birth, ID band Patient awake    Reviewed: Allergy & Precautions, H&P , Patient's Chart, lab work & pertinent test results, reviewed documented beta blocker date and time   History of Anesthesia Complications Negative for: history of anesthetic complications  Airway Mallampati: II TM Distance: >3 FB Neck ROM: full    Dental  (+) Teeth Intact   Pulmonary neg pulmonary ROS,  breath sounds clear to auscultation  Pulmonary exam normal       Cardiovascular negative cardio ROS      Neuro/Psych negative neurological ROS  negative psych ROS   GI/Hepatic negative GI ROS, Neg liver ROS,   Endo/Other  Morbid obesity  Renal/GU negative Renal ROS     Musculoskeletal negative musculoskeletal ROS (+)   Abdominal Normal abdominal exam  (+) + obese,   Peds  Hematology negative hematology ROS (+)   Anesthesia Other Findings   Reproductive/Obstetrics negative OB ROS                           Anesthesia Physical Anesthesia Plan  ASA: III  Anesthesia Plan: General   Post-op Pain Management:    Induction: Intravenous  Airway Management Planned: Oral ETT  Additional Equipment:   Intra-op Plan:   Post-operative Plan: Extubation in OR  Informed Consent: I have reviewed the patients History and Physical, chart, labs and discussed the procedure including the risks, benefits and alternatives for the proposed anesthesia with the patient or authorized representative who has indicated his/her understanding and acceptance.   Dental advisory given  Plan Discussed with: CRNA  Anesthesia Plan Comments:         Anesthesia Quick Evaluation

## 2012-12-10 ENCOUNTER — Encounter (HOSPITAL_COMMUNITY): Payer: Self-pay | Admitting: General Surgery

## 2012-12-10 LAB — CBC
HCT: 37.7 % (ref 36.0–46.0)
MCH: 27.7 pg (ref 26.0–34.0)
MCHC: 32.1 g/dL (ref 30.0–36.0)
MCV: 86.3 fL (ref 78.0–100.0)
RDW: 13.8 % (ref 11.5–15.5)

## 2012-12-10 LAB — BASIC METABOLIC PANEL
BUN: 4 mg/dL — ABNORMAL LOW (ref 6–23)
CO2: 29 mEq/L (ref 19–32)
Chloride: 103 mEq/L (ref 96–112)
Creatinine, Ser: 0.74 mg/dL (ref 0.50–1.10)
GFR calc Af Amer: >90 mL/min (ref >90)

## 2012-12-10 MED ORDER — DIAZEPAM 5 MG/ML IJ SOLN: 2.5000 mg | Freq: Four times a day (QID) | INTRAMUSCULAR | Status: DC | PRN

## 2012-12-10 MED ORDER — KETOROLAC TROMETHAMINE 30 MG/ML IJ SOLN
30.0000 mg | Freq: Four times a day (QID) | INTRAMUSCULAR | Status: DC
  Administered 2012-12-10 – 2012-12-11 (×5): 30 mg via INTRAVENOUS
  Filled 2012-12-10 (×8): qty 1

## 2012-12-10 NOTE — Progress Notes (Signed)
1 Day Post-Op  Subjective: No flatus. Lots of pain. No emesis.   Objective: Vital signs in last 24 hours: Temp:  [97.6 F (36.4 C)-98.9 F (37.2 C)] 97.9 F (36.6 C) (07/29 0624) Pulse Rate:  [55-76] 70 (07/29 0624) Resp:  [15-26] 20 (07/29 0723) BP: (113-129)/(64-89) 115/82 mmHg (07/29 0624) SpO2:  [97 %-100 %] 97 % (07/29 0723) Weight:  [268 lb 2 oz (121.621 kg)] 268 lb 2 oz (121.621 kg) (07/28 1324)    Intake/Output from previous day: 07/28 0701 - 07/29 0700 In: 3787.5 [P.O.:360; I.V.:3427.5] Out: 2050 [Urine:2050] Intake/Output this shift:    Alert, nad cta Reg Obese, soft, incisions c/d/i. On - q intact. +binder  Lab Results:   Recent Labs  12/10/12 0416  WBC 9.9  HGB 12.1  HCT 37.7  PLT 203   BMET  Recent Labs  12/10/12 0416  NA 137  K 3.7  CL 103  CO2 29  GLUCOSE 119*  BUN 4*  CREATININE 0.74  CALCIUM 8.7   PT/INR No results found for this basename: LABPROT, INR,  in the last 72 hours ABG No results found for this basename: PHART, PCO2, PO2, HCO3,  in the last 72 hours  Studies/Results: No results found.  Anti-infectives: Anti-infectives   Start     Dose/Rate Route Frequency Ordered Stop   12/09/12 0815  ceFAZolin (ANCEF) 3 g in dextrose 5 % 50 mL IVPB  Status:  Discontinued     3 g 160 mL/hr over 30 Minutes Intravenous On call to O.R. 12/09/12 0806 12/09/12 1257      Assessment/Plan: s/p Procedure(s): INSERTION OF MESH (N/A) LAPAROSCOPIC REPAIR OF RECURRENT INCISIONAL VENTRAL HERNIA WITH MESH  Adv diet as tolerated Add toradol for pain Add valium for spasms Ambulate pulm toilet Cont VTE prophylaxis  Not ready for discharge.   Mary Sella. Andrey Campanile, MD, FACS General, Bariatric, & Minimally Invasive Surgery [facility], [place]   LOS: 1 day    [name] 12/10/2012

## 2012-12-11 LAB — CBC
MCH: 27.9 pg (ref 26.0–34.0)
MCHC: 31.9 g/dL (ref 30.0–36.0)
Platelets: 175 10*3/uL (ref 150–400)

## 2012-12-11 MED ORDER — HYDROCODONE-ACETAMINOPHEN 5-325 MG PO TABS: 1.0000 | ORAL_TABLET | ORAL | Status: DC | PRN

## 2012-12-11 MED ORDER — CYCLOBENZAPRINE HCL 10 MG PO TABS: 10.0000 mg | ORAL_TABLET | Freq: Three times a day (TID) | ORAL | Status: DC | PRN

## 2012-12-11 MED ORDER — BUPIVACAINE ON-Q PAIN PUMP (FOR ORDER SET NO CHG): 1.0000 | INJECTION | Status: DC

## 2012-12-11 MED ORDER — MORPHINE SULFATE 2 MG/ML IJ SOLN: 1.0000 mg | INTRAMUSCULAR | Status: DC | PRN

## 2012-12-11 NOTE — Discharge Summary (Signed)
Physician Discharge Summary  Rhonda Rodgers:[id number] DOB: [birth year] DOA: 12/09/2012  PCP: Provider Not In System  Admit date: 12/09/2012 Discharge date: 12/11/2012  Recommendations for Outpatient Follow-up:   Follow-up Information   Follow up with Rhonda Ina, MD. Schedule an appointment as soon as possible for a visit in 3 weeks.   Contact information:   [address] [place] [address] [phone]      Discharge Diagnoses:  Patient Active Problem List   Diagnosis Date Noted  . Incisional hernia without mention of obstruction or gangrene 11/22/2012  . Obesity 11/29/2010   Surgical Procedure: Laparoscopic repair of recurrent ventral incisional hernia with mesh, placement of On-Q pain catheter  Discharge Condition: Good Disposition: Home  Diet recommendation: Regular  Filed Weights   12/09/12 1324  Weight: 268 lb 2 oz (121.621 kg)    Hospital Course:  Patient was admitted for a planned laparoscopic repair of a recurrent ventral incisional hernia with mesh. She had undergone an open primary repair of umbilical hernia several years ago however she had her early recurrence. The size of her defect preoperatively was around 4 x 3 cm. Intraoperatively her defect measured 5 x 5 cm. She underwent closure of the fascia laparoscopically followed by a mesh underlay. An On-Q pain catheters were placed for postoperative pain control. She was also given a morphine PCA for pain control. She was maintained on perioperative DVT prophylaxis. Her diet was gradually advanced. She was unable to go home on postoperative day 1 due to pain issues. Toradol was scheduled for pain control. On postoperative day 2 she was tolerating a regular diet. She was angulating. Her vital signs are stable. Her incisions were clean dry and intact. She had a bowel movement. She was sent home with her On-Q pain catheters in place. She was given verbal as well as written instructions on how to  remove the On-Q pain catheter  BP 101/70  Pulse 71  Temp(Src) 98.1 F (36.7 C) (Oral)  Resp 20  Ht 5\' 9"  (1.753 m)  Wt 268 lb 2 oz (121.621 kg)  BMI 39.58 kg/m2  SpO2 99%  LMP 11/09/2012  Gen: alert, NAD, non-toxic appearing, obese Pupils: equal, no scleral icterus Pulm: Lungs clear to auscultation, symmetric chest rise CV: regular rate and rhythm Abd: soft, appropriate tender, nondistended.  No cellulitis. No incisional hernia Ext: no edema, no calf tenderness Skin: no rash, no jaundice    Discharge Instructions      Discharge Orders   Future Orders Complete By Expires     Diet general  As directed     Increase activity slowly  As directed         Medication List    STOP taking these medications       acetaminophen 500 MG tablet  Commonly known as:  TYLENOL      TAKE these medications       BUPIVACAINE ON-Q PAIN PUMP (FOR ORDER SET NO CHG)  1 each by Other route continuous.     cyclobenzaprine 10 MG tablet  Commonly known as:  FLEXERIL  Take 1 tablet (10 mg total) by mouth 3 (three) times daily as needed for muscle spasms.     HYDROcodone-acetaminophen 5-325 MG per tablet  Commonly known as:  NORCO  Take 1-2 tablets by mouth every 4 (four) hours as needed for pain.       Follow-up Information   Follow up with Rhonda Ina, MD. Schedule an appointment as soon  as possible for a visit in 3 weeks.   Contact information:   [address] [place] [address] [phone]        The results of significant diagnostics from this hospitalization (including imaging, microbiology, ancillary and laboratory) are listed below for reference.    Significant Diagnostic Studies: No results found.  Microbiology: No results found for this or any previous visit (from the past 240 hour(s)).   Labs: Basic Metabolic Panel:  Recent Labs Lab 12/10/12 0416  NA 137  K 3.7  CL 103  CO2 29  GLUCOSE 119*  BUN 4*  CREATININE 0.74  CALCIUM 8.7    CBC:  Recent Labs Lab 12/10/12 0416 12/11/12 0347  WBC 9.9 7.7  HGB 12.1 11.5*  HCT 37.7 36.1  MCV 86.3 87.6  PLT 203 175    Principal Problem:   Incisional hernia without mention of obstruction or gangrene Active Problems:   Obesity   Time coordinating discharge: 15 minute  Signed:  [name], MD [facility], [place] [phone] 12/11/2012, 1:48 PM

## 2012-12-11 NOTE — Plan of Care (Signed)
Problem: Discharge Progression Outcomes Goal: Tubes and drains discontinued if indicated Outcome: Not Met (add Reason) Pt will discontinue ON Q pump at home per instructions.

## 2012-12-11 NOTE — Care Management Note (Signed)
    Page 1 of 1   12/11/2012     10:30:59 AM   CARE MANAGEMENT NOTE 12/11/2012  Patient:  Rhonda Rodgers, Rhonda Rodgers   Account Number:  [id number]  Date Initiated:  12/11/2012  Documentation initiated by:  Lorenda Ishihara  Subjective/Objective Assessment:   32 yo female admitted s/p ventral hernia repair. PTA lived at home with spouse.     Action/Plan:   Home when stable   Anticipated DC Date:  12/11/2012   Anticipated DC Plan:  HOME/SELF CARE      DC Planning Services  CM consult      Choice offered to / List presented to:             Status of service:  Completed, signed off Medicare Important Message given?   (If response is "NO", the following Medicare IM given date fields will be blank) Date Medicare IM given:   Date Additional Medicare IM given:    Discharge Disposition:  HOME/SELF CARE  Per UR Regulation:  Reviewed for med. necessity/level of care/duration of stay  If discussed at Long Length of Stay Meetings, dates discussed:    Comments:

## 2012-12-12 ENCOUNTER — Telehealth (INDEPENDENT_AMBULATORY_CARE_PROVIDER_SITE_OTHER): Payer: Self-pay | Admitting: General Surgery

## 2012-12-12 NOTE — Telephone Encounter (Signed)
Called to set up pts follow up appt for 01/01/13 @ 2:00..patient is agreeable

## 2012-12-13 ENCOUNTER — Telehealth (INDEPENDENT_AMBULATORY_CARE_PROVIDER_SITE_OTHER): Payer: Self-pay | Admitting: *Deleted

## 2012-12-13 NOTE — Telephone Encounter (Signed)
Patient called to speak with Dr. Andrey Campanile.  Asked patient about what was going on.  Patient states that it looks like her hernia is still there.  Explained to patient that this is just swelling following surgery.  Explained to patient that sometimes fluid from the inflammation can fill that area where the hernia use to be and cause it to look like the hernia is still there however it is just fluid.  Explained to patient that she will see that start going down over the next week or two.  Patient states understanding at this time and is no longer concerned.  Also explained to patient that a message will be sent on to Dr. Andrey Campanile and if thinks this is something different or concerning to him then we will give her a call back.  Patient is agreeable with this plan at this time and states she feels much better knowing this is normal.

## 2012-12-19 ENCOUNTER — Telehealth (INDEPENDENT_AMBULATORY_CARE_PROVIDER_SITE_OTHER): Payer: Self-pay

## 2012-12-19 NOTE — Telephone Encounter (Signed)
The pt's husband called for a pain medicine refill.  She is on Hydrocodone and hasn't had a refill.  I told him I have a protocol and can call some in.  She is doing well besides some pain.  He states Percocet makes her sick and he wonders if there is something a little stronger than Hydrocodone but not as much as the Percocet. I told him no but asked him to get her to try Ibuprofen or Advil and alternate it between the Hydrocodone.  Call if no better.

## 2013-01-01 ENCOUNTER — Ambulatory Visit (INDEPENDENT_AMBULATORY_CARE_PROVIDER_SITE_OTHER): Payer: BC Managed Care – PPO | Admitting: General Surgery

## 2013-01-01 ENCOUNTER — Encounter (INDEPENDENT_AMBULATORY_CARE_PROVIDER_SITE_OTHER): Payer: Self-pay | Admitting: General Surgery

## 2013-01-01 VITALS — BP 126/78 | HR 76 | Temp 98.6°F | Resp 14 | Ht 69.0 in | Wt 263.2 lb

## 2013-01-01 DIAGNOSIS — Z09 Encounter for follow-up examination after completed treatment for conditions other than malignant neoplasm: Secondary | ICD-10-CM

## 2013-01-01 MED ORDER — TRAMADOL HCL 50 MG PO TABS: 50.0000 mg | ORAL_TABLET | Freq: Four times a day (QID) | ORAL | Status: DC | PRN

## 2013-01-01 NOTE — Patient Instructions (Signed)
Can do light cardio now (walking, low incline treadmill/ellipitcal) Can resume full activity on/around Sept 8

## 2013-01-01 NOTE — Progress Notes (Signed)
Subjective:     Patient ID: Rhonda Rodgers, female   DOB: 1980-07-31, 32 y.o.   MRN: [id number]  HPI 32 year old obese Caucasian female comes in today for her first postoperative appointment. She was in the hospital from July 28 through July 30 for a laparoscopic repair of recurrent incisional hernia with mesh. Her postoperative course has been unremarkable. She states that the abdominal pain has significantly improved however there were 2 spots but still cause her problems throughout the day especially with any type of aggressive abdominal movement. She states it is still tender to lay on her right side at night. She also has some continued intermittent nausea. She denies any fever, chills, diarrhea, constipation, or incisional problems.  Review of Systems     Objective:   Physical Exam BP 126/78  Pulse 76  Temp(Src) 98.6 F (37 C) (Temporal)  Resp 14  Ht 5\' 9"  (1.753 m)  Wt 263 lb 3.2 oz (119.387 kg)  BMI 38.85 kg/m2  Gen: alert, NAD, non-toxic appearing Pupils: equal, no scleral icterus Pulm: Lungs clear to auscultation, symmetric chest rise CV: regular rate and rhythm Abd: soft,obese, nondistended. Well-healed trocar sites. No cellulitis. No incisional hernia. Some dimpling on superior aspect and right side of abdomen Skin: no rash, no jaundice     Assessment:     S/p laparoscopic repair of incisional ventral hernia with mesh     Plan:     Doing as expected. Explained pain will continue to improve. rx for ultram given. Can do light cardio. Reminded no heavy lifting until after 9/8. F/u 8 weeks.  Mary Sella. Andrey Campanile, MD, FACS General, Bariatric, & Minimally Invasive Surgery [facility], [place]

## 2013-07-14 ENCOUNTER — Ambulatory Visit: Payer: BC Managed Care – PPO | Admitting: Obstetrics and Gynecology

## 2013-07-14 DIAGNOSIS — Z01419 Encounter for gynecological examination (general) (routine) without abnormal findings: Secondary | ICD-10-CM

## 2013-10-04 ENCOUNTER — Emergency Department: Payer: Self-pay | Admitting: Emergency Medicine

## 2013-10-10 ENCOUNTER — Emergency Department: Payer: Self-pay | Admitting: Emergency Medicine

## 2013-12-10 ENCOUNTER — Encounter: Payer: Self-pay | Admitting: Family Medicine

## 2013-12-10 ENCOUNTER — Ambulatory Visit (INDEPENDENT_AMBULATORY_CARE_PROVIDER_SITE_OTHER): Payer: BC Managed Care – PPO | Admitting: Family Medicine

## 2013-12-10 VITALS — BP 128/88 | HR 76 | Ht 69.0 in | Wt 278.0 lb

## 2013-12-10 DIAGNOSIS — K219 Gastro-esophageal reflux disease without esophagitis: Secondary | ICD-10-CM | POA: Insufficient documentation

## 2013-12-10 DIAGNOSIS — Z124 Encounter for screening for malignant neoplasm of cervix: Secondary | ICD-10-CM

## 2013-12-10 DIAGNOSIS — Z01419 Encounter for gynecological examination (general) (routine) without abnormal findings: Secondary | ICD-10-CM

## 2013-12-10 DIAGNOSIS — N915 Oligomenorrhea, unspecified: Secondary | ICD-10-CM

## 2013-12-10 DIAGNOSIS — Z113 Encounter for screening for infections with a predominantly sexual mode of transmission: Secondary | ICD-10-CM

## 2013-12-10 DIAGNOSIS — Z1151 Encounter for screening for human papillomavirus (HPV): Secondary | ICD-10-CM

## 2013-12-10 LAB — TSH: TSH: 3.219 u[IU]/mL (ref 0.350–4.500)

## 2013-12-10 NOTE — Patient Instructions (Signed)
Preventive Care for Adults A healthy lifestyle and preventive care can promote health and wellness. Preventive health guidelines for women include the following key practices.  A routine yearly physical is a good way to check with your health care provider about your health and preventive screening. It is a chance to share any concerns and updates on your health and to receive a thorough exam.  Visit your dentist for a routine exam and preventive care every 6 months. Brush your teeth twice a day and floss once a day. Good oral hygiene prevents tooth decay and gum disease.  The frequency of eye exams is based on your age, health, family medical history, use of contact lenses, and other factors. Follow your health care provider's recommendations for frequency of eye exams.  Eat a healthy diet. Foods like vegetables, fruits, whole grains, low-fat dairy products, and lean protein foods contain the nutrients you need without too many calories. Decrease your intake of foods high in solid fats, added sugars, and salt. Eat the right amount of calories for you.Get information about a proper diet from your health care provider, if necessary.  Regular physical exercise is one of the most important things you can do for your health. Most adults should get at least 150 minutes of moderate-intensity exercise (any activity that increases your heart rate and causes you to sweat) each week. In addition, most adults need muscle-strengthening exercises on 2 or more days a week.  Maintain a healthy weight. The body mass index (BMI) is a screening tool to identify possible weight problems. It provides an estimate of body fat based on height and weight. Your health care provider can find your BMI and can help you achieve or maintain a healthy weight.For adults 20 years and older:  A BMI below 18.5 is considered underweight.  A BMI of 18.5 to 24.9 is normal.  A BMI of 25 to 29.9 is considered overweight.  A BMI of  30 and above is considered obese.  Maintain normal blood lipids and cholesterol levels by exercising and minimizing your intake of saturated fat. Eat a balanced diet with plenty of fruit and vegetables. Blood tests for lipids and cholesterol should begin at age 76 and be repeated every 5 years. If your lipid or cholesterol levels are high, you are over 50, or you are at high risk for heart disease, you may need your cholesterol levels checked more frequently.Ongoing high lipid and cholesterol levels should be treated with medicines if diet and exercise are not working.  If you smoke, find out from your health care provider how to quit. If you do not use tobacco, do not start.  Lung cancer screening is recommended for adults aged 22-80 years who are at high risk for developing lung cancer because of a history of smoking. A yearly low-dose CT scan of the lungs is recommended for people who have at least a 30-pack-year history of smoking and are a current smoker or have quit within the past 15 years. A pack year of smoking is smoking an average of 1 pack of cigarettes a day for 1 year (for example: 1 pack a day for 30 years or 2 packs a day for 15 years). Yearly screening should continue until the smoker has stopped smoking for at least 15 years. Yearly screening should be stopped for people who develop a health problem that would prevent them from having lung cancer treatment.  If you are pregnant, do not drink alcohol. If you are breastfeeding,  be very cautious about drinking alcohol. If you are not pregnant and choose to drink alcohol, do not have more than 1 drink per day. One drink is considered to be 12 ounces (355 mL) of beer, 5 ounces (148 mL) of wine, or 1.5 ounces (44 mL) of liquor.  Avoid use of street drugs. Do not share needles with anyone. Ask for help if you need support or instructions about stopping the use of drugs.  High blood pressure causes heart disease and increases the risk of  stroke. Your blood pressure should be checked at least every 1 to 2 years. Ongoing high blood pressure should be treated with medicines if weight loss and exercise do not work.  If you are 75-52 years old, ask your health care provider if you should take aspirin to prevent strokes.  Diabetes screening involves taking a blood sample to check your fasting blood sugar level. This should be done once every 3 years, after age 15, if you are within normal weight and without risk factors for diabetes. Testing should be considered at a younger age or be carried out more frequently if you are overweight and have at least 1 risk factor for diabetes.  Breast cancer screening is essential preventive care for women. You should practice "breast self-awareness." This means understanding the normal appearance and feel of your breasts and may include breast self-examination. Any changes detected, no matter how small, should be reported to a health care provider. Women in their 58s and 30s should have a clinical breast exam (CBE) by a health care provider as part of a regular health exam every 1 to 3 years. After age 16, women should have a CBE every year. Starting at age 53, women should consider having a mammogram (breast X-ray test) every year. Women who have a family history of breast cancer should talk to their health care provider about genetic screening. Women at a high risk of breast cancer should talk to their health care providers about having an MRI and a mammogram every year.  Breast cancer gene (BRCA)-related cancer risk assessment is recommended for women who have family members with BRCA-related cancers. BRCA-related cancers include breast, ovarian, tubal, and peritoneal cancers. Having family members with these cancers may be associated with an increased risk for harmful changes (mutations) in the breast cancer genes BRCA1 and BRCA2. Results of the assessment will determine the need for genetic counseling and  BRCA1 and BRCA2 testing.  Routine pelvic exams to screen for cancer are no longer recommended for nonpregnant women who are considered low risk for cancer of the pelvic organs (ovaries, uterus, and vagina) and who do not have symptoms. Ask your health care provider if a screening pelvic exam is right for you.  If you have had past treatment for cervical cancer or a condition that could lead to cancer, you need Pap tests and screening for cancer for at least 20 years after your treatment. If Pap tests have been discontinued, your risk factors (such as having a new sexual partner) need to be reassessed to determine if screening should be resumed. Some women have medical problems that increase the chance of getting cervical cancer. In these cases, your health care provider may recommend more frequent screening and Pap tests.  The HPV test is an additional test that may be used for cervical cancer screening. The HPV test looks for the virus that can cause the cell changes on the cervix. The cells collected during the Pap test can be  tested for HPV. The HPV test could be used to screen women aged 30 years and older, and should be used in women of any age who have unclear Pap test results. After the age of 30, women should have HPV testing at the same frequency as a Pap test.  Colorectal cancer can be detected and often prevented. Most routine colorectal cancer screening begins at the age of 50 years and continues through age 75 years. However, your health care provider may recommend screening at an earlier age if you have risk factors for colon cancer. On a yearly basis, your health care provider may provide home test kits to check for hidden blood in the stool. Use of a small camera at the end of a tube, to directly examine the colon (sigmoidoscopy or colonoscopy), can detect the earliest forms of colorectal cancer. Talk to your health care provider about this at age 50, when routine screening begins. Direct  exam of the colon should be repeated every 5-10 years through age 75 years, unless early forms of pre-cancerous polyps or small growths are found.  People who are at an increased risk for hepatitis B should be screened for this virus. You are considered at high risk for hepatitis B if:  You were born in a country where hepatitis B occurs often. Talk with your health care provider about which countries are considered high risk.  Your parents were born in a high-risk country and you have not received a shot to protect against hepatitis B (hepatitis B vaccine).  You have HIV or AIDS.  You use needles to inject street drugs.  You live with, or have sex with, someone who has hepatitis B.  You get hemodialysis treatment.  You take certain medicines for conditions like cancer, organ transplantation, and autoimmune conditions.  Hepatitis C blood testing is recommended for all people born from 1945 through 1965 and any individual with known risks for hepatitis C.  Practice safe sex. Use condoms and avoid high-risk sexual practices to reduce the spread of sexually transmitted infections (STIs). STIs include gonorrhea, chlamydia, syphilis, trichomonas, herpes, HPV, and human immunodeficiency virus (HIV). Herpes, HIV, and HPV are viral illnesses that have no cure. They can result in disability, cancer, and death.  You should be screened for sexually transmitted illnesses (STIs) including gonorrhea and chlamydia if:  You are sexually active and are younger than 24 years.  You are older than 24 years and your health care provider tells you that you are at risk for this type of infection.  Your sexual activity has changed since you were last screened and you are at an increased risk for chlamydia or gonorrhea. Ask your health care provider if you are at risk.  If you are at risk of being infected with HIV, it is recommended that you take a prescription medicine daily to prevent HIV infection. This is  called preexposure prophylaxis (PrEP). You are considered at risk if:  You are a heterosexual woman, are sexually active, and are at increased risk for HIV infection.  You take drugs by injection.  You are sexually active with a partner who has HIV.  Talk with your health care provider about whether you are at high risk of being infected with HIV. If you choose to begin PrEP, you should first be tested for HIV. You should then be tested every 3 months for as long as you are taking PrEP.  Osteoporosis is a disease in which the bones lose minerals and strength   with aging. This can result in serious bone fractures or breaks. The risk of osteoporosis can be identified using a bone density scan. Women ages 65 years and over and women at risk for fractures or osteoporosis should discuss screening with their health care providers. Ask your health care provider whether you should take a calcium supplement or vitamin D to reduce the rate of osteoporosis.  Menopause can be associated with physical symptoms and risks. Hormone replacement therapy is available to decrease symptoms and risks. You should talk to your health care provider about whether hormone replacement therapy is right for you.  Use sunscreen. Apply sunscreen liberally and repeatedly throughout the day. You should seek shade when your shadow is shorter than you. Protect yourself by wearing long sleeves, pants, a wide-brimmed hat, and sunglasses year round, whenever you are outdoors.  Once a month, do a whole body skin exam, using a mirror to look at the skin on your back. Tell your health care provider of new moles, moles that have irregular borders, moles that are larger than a pencil eraser, or moles that have changed in shape or color.  Stay current with required vaccines (immunizations).  Influenza vaccine. All adults should be immunized every year.  Tetanus, diphtheria, and acellular pertussis (Td, Tdap) vaccine. Pregnant women should  receive 1 dose of Tdap vaccine during each pregnancy. The dose should be obtained regardless of the length of time since the last dose. Immunization is preferred during the 27th-36th week of gestation. An adult who has not previously received Tdap or who does not know her vaccine status should receive 1 dose of Tdap. This initial dose should be followed by tetanus and diphtheria toxoids (Td) booster doses every 10 years. Adults with an unknown or incomplete history of completing a 3-dose immunization series with Td-containing vaccines should begin or complete a primary immunization series including a Tdap dose. Adults should receive a Td booster every 10 years.  Varicella vaccine. An adult without evidence of immunity to varicella should receive 2 doses or a second dose if she has previously received 1 dose. Pregnant females who do not have evidence of immunity should receive the first dose after pregnancy. This first dose should be obtained before leaving the health care facility. The second dose should be obtained 4-8 weeks after the first dose.  Human papillomavirus (HPV) vaccine. Females aged 13-26 years who have not received the vaccine previously should obtain the 3-dose series. The vaccine is not recommended for use in pregnant females. However, pregnancy testing is not needed before receiving a dose. If a female is found to be pregnant after receiving a dose, no treatment is needed. In that case, the remaining doses should be delayed until after the pregnancy. Immunization is recommended for any person with an immunocompromised condition through the age of 26 years if she did not get any or all doses earlier. During the 3-dose series, the second dose should be obtained 4-8 weeks after the first dose. The third dose should be obtained 24 weeks after the first dose and 16 weeks after the second dose.  Zoster vaccine. One dose is recommended for adults aged 60 years or older unless certain conditions are  present.  Measles, mumps, and rubella (MMR) vaccine. Adults born before 1957 generally are considered immune to measles and mumps. Adults born in 1957 or later should have 1 or more doses of MMR vaccine unless there is a contraindication to the vaccine or there is laboratory evidence of immunity to   each of the three diseases. A routine second dose of MMR vaccine should be obtained at least 28 days after the first dose for students attending postsecondary schools, health care workers, or international travelers. People who received inactivated measles vaccine or an unknown type of measles vaccine during 1963-1967 should receive 2 doses of MMR vaccine. People who received inactivated mumps vaccine or an unknown type of mumps vaccine before 1979 and are at high risk for mumps infection should consider immunization with 2 doses of MMR vaccine. For females of childbearing age, rubella immunity should be determined. If there is no evidence of immunity, females who are not pregnant should be vaccinated. If there is no evidence of immunity, females who are pregnant should delay immunization until after pregnancy. Unvaccinated health care workers born before 1957 who lack laboratory evidence of measles, mumps, or rubella immunity or laboratory confirmation of disease should consider measles and mumps immunization with 2 doses of MMR vaccine or rubella immunization with 1 dose of MMR vaccine.  Pneumococcal 13-valent conjugate (PCV13) vaccine. When indicated, a person who is uncertain of her immunization history and has no record of immunization should receive the PCV13 vaccine. An adult aged 19 years or older who has certain medical conditions and has not been previously immunized should receive 1 dose of PCV13 vaccine. This PCV13 should be followed with a dose of pneumococcal polysaccharide (PPSV23) vaccine. The PPSV23 vaccine dose should be obtained at least 8 weeks after the dose of PCV13 vaccine. An adult aged 19  years or older who has certain medical conditions and previously received 1 or more doses of PPSV23 vaccine should receive 1 dose of PCV13. The PCV13 vaccine dose should be obtained 1 or more years after the last PPSV23 vaccine dose.  Pneumococcal polysaccharide (PPSV23) vaccine. When PCV13 is also indicated, PCV13 should be obtained first. All adults aged 65 years and older should be immunized. An adult younger than age 65 years who has certain medical conditions should be immunized. Any person who resides in a nursing home or long-term care facility should be immunized. An adult smoker should be immunized. People with an immunocompromised condition and certain other conditions should receive both PCV13 and PPSV23 vaccines. People with human immunodeficiency virus (HIV) infection should be immunized as soon as possible after diagnosis. Immunization during chemotherapy or radiation therapy should be avoided. Routine use of PPSV23 vaccine is not recommended for American Indians, [place] Natives, or people younger than 65 years unless there are medical conditions that require PPSV23 vaccine. When indicated, people who have unknown immunization and have no record of immunization should receive PPSV23 vaccine. One-time revaccination 5 years after the first dose of PPSV23 is recommended for people aged 19-64 years who have chronic kidney failure, nephrotic syndrome, asplenia, or immunocompromised conditions. People who received 1-2 doses of PPSV23 before age 65 years should receive another dose of PPSV23 vaccine at age 65 years or later if at least 5 years have passed since the previous dose. Doses of PPSV23 are not needed for people immunized with PPSV23 at or after age 65 years.  Meningococcal vaccine. Adults with asplenia or persistent complement component deficiencies should receive 2 doses of quadrivalent meningococcal conjugate (MenACWY-D) vaccine. The doses should be obtained at least 2 months apart.  Microbiologists working with certain meningococcal bacteria, military recruits, people at risk during an outbreak, and people who travel to or live in countries with a high rate of meningitis should be immunized. A first-year college student up through age   21 years who is living in a residence hall should receive a dose if she did not receive a dose on or after her 16th birthday. Adults who have certain high-risk conditions should receive one or more doses of vaccine.  Hepatitis A vaccine. Adults who wish to be protected from this disease, have certain high-risk conditions, work with hepatitis A-infected animals, work in hepatitis A research labs, or travel to or work in countries with a high rate of hepatitis A should be immunized. Adults who were previously unvaccinated and who anticipate close contact with an international adoptee during the first 60 days after arrival in the [place] from a country with a high rate of hepatitis A should be immunized.  Hepatitis B vaccine. Adults who wish to be protected from this disease, have certain high-risk conditions, may be exposed to blood or other infectious body fluids, are household contacts or sex partners of hepatitis B positive people, are clients or workers in certain care facilities, or travel to or work in countries with a high rate of hepatitis B should be immunized.  Haemophilus influenzae type b (Hib) vaccine. A previously unvaccinated person with asplenia or sickle cell disease or having a scheduled splenectomy should receive 1 dose of Hib vaccine. Regardless of previous immunization, a recipient of a hematopoietic stem cell transplant should receive a 3-dose series 6-12 months after her successful transplant. Hib vaccine is not recommended for adults with HIV infection. Preventive Services / Frequency Ages 64 to 68 years  Blood pressure check.** / Every 1 to 2 years.  Lipid and cholesterol check.** / Every 5 years beginning at age  22.  Clinical breast exam.** / Every 3 years for women in their 88s and 53s.  BRCA-related cancer risk assessment.** / For women who have family members with a BRCA-related cancer (breast, ovarian, tubal, or peritoneal cancers).  Pap test.** / Every 2 years from ages 90 through 51. Every 3 years starting at age 21 through age 56 or 3 with a history of 3 consecutive normal Pap tests.  HPV screening.** / Every 3 years from ages 24 through ages 1 to 46 with a history of 3 consecutive normal Pap tests.  Hepatitis C blood test.** / For any individual with known risks for hepatitis C.  Skin self-exam. / Monthly.  Influenza vaccine. / Every year.  Tetanus, diphtheria, and acellular pertussis (Tdap, Td) vaccine.** / Consult your health care provider. Pregnant women should receive 1 dose of Tdap vaccine during each pregnancy. 1 dose of Td every 10 years.  Varicella vaccine.** / Consult your health care provider. Pregnant females who do not have evidence of immunity should receive the first dose after pregnancy.  HPV vaccine. / 3 doses over 6 months, if 72 and younger. The vaccine is not recommended for use in pregnant females. However, pregnancy testing is not needed before receiving a dose.  Measles, mumps, rubella (MMR) vaccine.** / You need at least 1 dose of MMR if you were born in 1957 or later. You may also need a 2nd dose. For females of childbearing age, rubella immunity should be determined. If there is no evidence of immunity, females who are not pregnant should be vaccinated. If there is no evidence of immunity, females who are pregnant should delay immunization until after pregnancy.  Pneumococcal 13-valent conjugate (PCV13) vaccine.** / Consult your health care provider.  Pneumococcal polysaccharide (PPSV23) vaccine.** / 1 to 2 doses if you smoke cigarettes or if you have certain conditions.  Meningococcal vaccine.** /  1 dose if you are age 19 to 21 years and a first-year college  student living in a residence hall, or have one of several medical conditions, you need to get vaccinated against meningococcal disease. You may also need additional booster doses.  Hepatitis A vaccine.** / Consult your health care provider.  Hepatitis B vaccine.** / Consult your health care provider.  Haemophilus influenzae type b (Hib) vaccine.** / Consult your health care provider. Ages 40 to 64 years  Blood pressure check.** / Every 1 to 2 years.  Lipid and cholesterol check.** / Every 5 years beginning at age 20 years.  Lung cancer screening. / Every year if you are aged 55-80 years and have a 30-pack-year history of smoking and currently smoke or have quit within the past 15 years. Yearly screening is stopped once you have quit smoking for at least 15 years or develop a health problem that would prevent you from having lung cancer treatment.  Clinical breast exam.** / Every year after age 40 years.  BRCA-related cancer risk assessment.** / For women who have family members with a BRCA-related cancer (breast, ovarian, tubal, or peritoneal cancers).  Mammogram.** / Every year beginning at age 40 years and continuing for as long as you are in good health. Consult with your health care provider.  Pap test.** / Every 3 years starting at age 30 years through age 65 or 70 years with a history of 3 consecutive normal Pap tests.  HPV screening.** / Every 3 years from ages 30 years through ages 65 to 70 years with a history of 3 consecutive normal Pap tests.  Fecal occult blood test (FOBT) of stool. / Every year beginning at age 50 years and continuing until age 75 years. You may not need to do this test if you get a colonoscopy every 10 years.  Flexible sigmoidoscopy or colonoscopy.** / Every 5 years for a flexible sigmoidoscopy or every 10 years for a colonoscopy beginning at age 50 years and continuing until age 75 years.  Hepatitis C blood test.** / For all people born from 1945 through  1965 and any individual with known risks for hepatitis C.  Skin self-exam. / Monthly.  Influenza vaccine. / Every year.  Tetanus, diphtheria, and acellular pertussis (Tdap/Td) vaccine.** / Consult your health care provider. Pregnant women should receive 1 dose of Tdap vaccine during each pregnancy. 1 dose of Td every 10 years.  Varicella vaccine.** / Consult your health care provider. Pregnant females who do not have evidence of immunity should receive the first dose after pregnancy.  Zoster vaccine.** / 1 dose for adults aged 60 years or older.  Measles, mumps, rubella (MMR) vaccine.** / You need at least 1 dose of MMR if you were born in 1957 or later. You may also need a 2nd dose. For females of childbearing age, rubella immunity should be determined. If there is no evidence of immunity, females who are not pregnant should be vaccinated. If there is no evidence of immunity, females who are pregnant should delay immunization until after pregnancy.  Pneumococcal 13-valent conjugate (PCV13) vaccine.** / Consult your health care provider.  Pneumococcal polysaccharide (PPSV23) vaccine.** / 1 to 2 doses if you smoke cigarettes or if you have certain conditions.  Meningococcal vaccine.** / Consult your health care provider.  Hepatitis A vaccine.** / Consult your health care provider.  Hepatitis B vaccine.** / Consult your health care provider.  Haemophilus influenzae type b (Hib) vaccine.** / Consult your health care provider. Ages 65   years and over  Blood pressure check.** / Every 1 to 2 years.  Lipid and cholesterol check.** / Every 5 years beginning at age 22 years.  Lung cancer screening. / Every year if you are aged 73-80 years and have a 30-pack-year history of smoking and currently smoke or have quit within the past 15 years. Yearly screening is stopped once you have quit smoking for at least 15 years or develop a health problem that would prevent you from having lung cancer  treatment.  Clinical breast exam.** / Every year after age 4 years.  BRCA-related cancer risk assessment.** / For women who have family members with a BRCA-related cancer (breast, ovarian, tubal, or peritoneal cancers).  Mammogram.** / Every year beginning at age 40 years and continuing for as long as you are in good health. Consult with your health care provider.  Pap test.** / Every 3 years starting at age 9 years through age 34 or 91 years with 3 consecutive normal Pap tests. Testing can be stopped between 65 and 70 years with 3 consecutive normal Pap tests and no abnormal Pap or HPV tests in the past 10 years.  HPV screening.** / Every 3 years from ages 57 years through ages 64 or 45 years with a history of 3 consecutive normal Pap tests. Testing can be stopped between 65 and 70 years with 3 consecutive normal Pap tests and no abnormal Pap or HPV tests in the past 10 years.  Fecal occult blood test (FOBT) of stool. / Every year beginning at age 15 years and continuing until age 17 years. You may not need to do this test if you get a colonoscopy every 10 years.  Flexible sigmoidoscopy or colonoscopy.** / Every 5 years for a flexible sigmoidoscopy or every 10 years for a colonoscopy beginning at age 86 years and continuing until age 71 years.  Hepatitis C blood test.** / For all people born from [birth year] through 1966 and any individual with known risks for hepatitis C.  Osteoporosis screening.** / A one-time screening for women ages 83 years and over and women at risk for fractures or osteoporosis.  Skin self-exam. / Monthly.  Influenza vaccine. / Every year.  Tetanus, diphtheria, and acellular pertussis (Tdap/Td) vaccine.** / 1 dose of Td every 10 years.  Varicella vaccine.** / Consult your health care provider.  Zoster vaccine.** / 1 dose for adults aged 61 years or older.  Pneumococcal 13-valent conjugate (PCV13) vaccine.** / Consult your health care provider.  Pneumococcal  polysaccharide (PPSV23) vaccine.** / 1 dose for all adults aged 28 years and older.  Meningococcal vaccine.** / Consult your health care provider.  Hepatitis A vaccine.** / Consult your health care provider.  Hepatitis B vaccine.** / Consult your health care provider.  Haemophilus influenzae type b (Hib) vaccine.** / Consult your health care provider. ** Family history and personal history of risk and conditions may change your health care provider's recommendations. Document Released: 06/27/2001 Document Revised: 09/15/2013 Document Reviewed: 09/26/2010 [facility] Patient Information 2015 [place], [place]. This information is not intended to replace advice given to you by your health care provider. Make sure you discuss any questions you have with your health care provider.

## 2013-12-10 NOTE — Progress Notes (Signed)
  Subjective:     Rhonda Rodgers is a 33 y.o. female and is here for a comprehensive physical exam. The patient reports no problems. Her husband has been unfaithful, she would like STD screening.  She has had irregular cycles.  S/p BTL but no cycles x 2 months.  Recent CPE with PCP with blood work.  Last TSH 2013.  History   Social History  . Marital Status: Married    Spouse Name: N/A    Number of Children: N/A  . Years of Education: N/A   Occupational History  . Not on file.   Social History Main Topics  . Smoking status: Never Smoker   . Smokeless tobacco: Never Used  . Alcohol Use: No  . Drug Use: No  . Sexual Activity: Yes    Partners: Male    Birth Control/ Protection: Surgical     Comment: tubaligation   Other Topics Concern  . Not on file   Social History Narrative  . No narrative on file   Health Maintenance  Topic Date Due  . Tetanus/tdap  08/30/1998  . Influenza Vaccine  12/13/2013  . Pap Smear  02/13/2015    The following portions of the patient's history were reviewed and updated as appropriate: allergies, current medications, past family history, past medical history, past social history, past surgical history and problem list.  Review of Systems A comprehensive review of systems was negative.   Objective:    BP 128/88  Pulse 76  Ht 5\' 9"  (1.753 m)  Wt 278 lb (126.1 kg)  BMI 41.03 kg/m2  LMP 10/13/2013 General appearance: alert, cooperative, appears stated age and morbidly obese Head: Normocephalic, without obvious abnormality, atraumatic Neck: no adenopathy, supple, symmetrical, trachea midline and thyroid not enlarged, symmetric, no tenderness/mass/nodules Lungs: clear to auscultation bilaterally Breasts: normal appearance, no masses or tenderness Heart: regular rate and rhythm, S1, S2 normal, no murmur, click, rub or gallop Abdomen: soft, non-tender; bowel sounds normal; no masses,  no organomegaly Pelvic: cervix normal in appearance,  external genitalia normal, no adnexal masses or tenderness, no cervical motion tenderness, uterus normal size, shape, and consistency and vagina normal without discharge Extremities: extremities normal, atraumatic, no cyanosis or edema Pulses: 2+ and symmetric Skin: Skin color, texture, turgor normal. No rashes or lesions Lymph nodes: Cervical, supraclavicular, and axillary nodes normal. Neurologic: Grossly normal    Assessment/Plan:    Healthy female exam.      Screen for STD (sexually transmitted disease) - Plan: RPR, HIV antibody, Cytology - PAP, Hepatitis B surface antigen, Hepatitis C antibody  Screening for malignant neoplasm of the cervix  Routine gynecological examination  Oligomenorrhea - Plan: TSH        See After Visit Summary for Counseling Recommendations

## 2013-12-11 LAB — HIV ANTIBODY (ROUTINE TESTING W REFLEX): HIV: NONREACTIVE

## 2013-12-11 LAB — HEPATITIS C ANTIBODY: HCV AB: NEGATIVE

## 2013-12-11 LAB — HEPATITIS B SURFACE ANTIGEN: HEP B S AG: NEGATIVE

## 2013-12-11 LAB — RPR

## 2013-12-15 LAB — CYTOLOGY - PAP

## 2014-01-07 ENCOUNTER — Telehealth: Payer: Self-pay | Admitting: *Deleted

## 2014-01-07 NOTE — Telephone Encounter (Signed)
Patient had yearly exam and has a history of irregular cycles.  It has been 3 months now and she still has not had a cycle. She wishes to wait one more month and if her cycle does not show up she will call back to make an appointment with Dr. Shawnie Pons.  She is under a lot of stress as she is going through a divorce so she feels like this might be contributing to her problem.  Her cycles have always been every other month.  Her lab work in July is normal.

## 2014-02-24 ENCOUNTER — Ambulatory Visit: Payer: BC Managed Care – PPO | Admitting: Obstetrics & Gynecology

## 2014-03-04 ENCOUNTER — Ambulatory Visit (INDEPENDENT_AMBULATORY_CARE_PROVIDER_SITE_OTHER): Payer: BC Managed Care – PPO | Admitting: Family Medicine

## 2014-03-04 ENCOUNTER — Encounter: Payer: Self-pay | Admitting: Family Medicine

## 2014-03-04 DIAGNOSIS — Z23 Encounter for immunization: Secondary | ICD-10-CM

## 2014-03-04 DIAGNOSIS — N915 Oligomenorrhea, unspecified: Secondary | ICD-10-CM | POA: Diagnosis not present

## 2014-03-04 DIAGNOSIS — Z113 Encounter for screening for infections with a predominantly sexual mode of transmission: Secondary | ICD-10-CM | POA: Diagnosis not present

## 2014-03-04 MED ORDER — MEDROXYPROGESTERONE ACETATE 10 MG PO TABS: 10.0000 mg | ORAL_TABLET | Freq: Every day | ORAL | Status: DC

## 2014-03-04 NOTE — Patient Instructions (Signed)

## 2014-03-04 NOTE — Progress Notes (Signed)
    Subjective:    Patient ID: Rhonda BowlerChristine A Rodgers is a 33 y.o. female presenting with No chief complaint on file.  on 03/04/2014  HPI: Had a sexual encounter and is unsure about protection.  She has vaginal discharge. Yellow, malodorous, with some irregular spotting. 3 months of no period after separation. Several days of spotting only. Previously has had monthly cycles until several months ago when began qo month. Has darker area in groin to be evaluated. Needs flu shot.  Review of Systems  Constitutional: Negative for fever and chills.  Respiratory: Negative for shortness of breath.   Cardiovascular: Negative for chest pain.  Gastrointestinal: Positive for abdominal pain (cramping). Negative for nausea and vomiting.  Genitourinary: Positive for vaginal bleeding and vaginal discharge. Negative for dysuria.  Skin: Negative for rash.      Objective:    There were no vitals taken for this visit. Physical Exam  Constitutional: She is oriented to person, place, and time. She appears well-developed and well-nourished. No distress.  HENT:  Head: Normocephalic and atraumatic.  Eyes: No scleral icterus.  Neck: Neck supple.  Cardiovascular: Normal rate.   Pulmonary/Chest: Effort normal.  Abdominal: Soft.  Neurological: She is alert and oriented to person, place, and time.  Skin: Skin is warm and dry.  Psychiatric: She has a normal mood and affect.        Assessment & Plan:  Screen for STD (sexually transmitted disease) - Plan: Hepatitis B surface antigen, Hepatitis C antibody, HIV antibody, RPR, GC/Chlamydia Probe Amp, Wet prep, genital  Need for immunization against influenza - Plan: Flu Vaccine QUAD 36+ mos IM (Fluarix)  Oligomenorrhea - Plan: medroxyPROGESTERone (PROVERA) 10 MG tablet  Give cycle every 3rd month if not spontaneous.  Treat for any positive on testing--consider 10d treatment with Doxycycline for endometritis.  Return in about 3 months (around  06/04/2014).

## 2014-03-04 NOTE — Progress Notes (Signed)
Patient is having irregular spotting and increased discharge.  She does have a history of BV but she did have a new sexual partner so she would like to be checked out.  She also has a spot on her mons that she would like you to look at.  She thought it might be an ingrown hair but wants to be sure.  She is recently separated for 4 months now.  She would also like a flu shot today.

## 2014-03-05 ENCOUNTER — Telehealth: Payer: Self-pay | Admitting: *Deleted

## 2014-03-05 DIAGNOSIS — B9689 Other specified bacterial agents as the cause of diseases classified elsewhere: Secondary | ICD-10-CM

## 2014-03-05 DIAGNOSIS — N760 Acute vaginitis: Principal | ICD-10-CM

## 2014-03-05 LAB — WET PREP, GENITAL
TRICH WET PREP: NONE SEEN
WBC, Wet Prep HPF POC: NONE SEEN
Yeast Wet Prep HPF POC: NONE SEEN

## 2014-03-05 LAB — HEPATITIS C ANTIBODY: HCV Ab: NEGATIVE

## 2014-03-05 LAB — HIV ANTIBODY (ROUTINE TESTING W REFLEX): HIV 1&2 Ab, 4th Generation: NONREACTIVE

## 2014-03-05 LAB — RPR

## 2014-03-05 LAB — GC/CHLAMYDIA PROBE AMP
CT Probe RNA: NEGATIVE
GC Probe RNA: NEGATIVE

## 2014-03-05 LAB — HEPATITIS B SURFACE ANTIGEN: HEP B S AG: NEGATIVE

## 2014-03-05 MED ORDER — METRONIDAZOLE 500 MG PO TABS: 500.0000 mg | ORAL_TABLET | Freq: Two times a day (BID) | ORAL | Status: DC

## 2014-03-05 NOTE — Telephone Encounter (Signed)
Advised patient of medication being called in for her.  She understands to take all of the medication and to not have any etoh while on medication.

## 2014-03-16 ENCOUNTER — Encounter: Payer: Self-pay | Admitting: Family Medicine

## 2014-03-31 ENCOUNTER — Encounter: Payer: Self-pay | Admitting: Family Medicine

## 2014-03-31 ENCOUNTER — Ambulatory Visit (INDEPENDENT_AMBULATORY_CARE_PROVIDER_SITE_OTHER): Payer: BC Managed Care – PPO | Admitting: Family Medicine

## 2014-03-31 VITALS — BP 107/69 | HR 69 | Ht 69.0 in | Wt 268.0 lb

## 2014-03-31 DIAGNOSIS — N921 Excessive and frequent menstruation with irregular cycle: Secondary | ICD-10-CM | POA: Diagnosis not present

## 2014-03-31 DIAGNOSIS — N920 Excessive and frequent menstruation with regular cycle: Secondary | ICD-10-CM | POA: Insufficient documentation

## 2014-03-31 DIAGNOSIS — N915 Oligomenorrhea, unspecified: Secondary | ICD-10-CM

## 2014-03-31 MED ORDER — MEDROXYPROGESTERONE ACETATE 10 MG PO TABS: 10.0000 mg | ORAL_TABLET | Freq: Every day | ORAL | Status: DC

## 2014-03-31 MED ORDER — MEGESTROL ACETATE 40 MG PO TABS: 40.0000 mg | ORAL_TABLET | Freq: Three times a day (TID) | ORAL | Status: DC

## 2014-03-31 NOTE — Progress Notes (Signed)
Patient has been bleeding since October 25th heavy.  She also would like to ask for a prescription of ibuprofen 600mg  to take when she is cramping and bleeding heavy.

## 2014-03-31 NOTE — Progress Notes (Signed)
    Subjective:    Patient ID: Rhonda Rodgers is a 33 y.o. female presenting with Metrorrhagia  on 03/31/2014  HPI: No cycle for 3 months.  Took Provera and cycle started 10/27 and bleeding has been heavy since then.  Has lose 25#. Takes Ibuprofen which makes it lighter.  Review of Systems  Constitutional: Positive for fatigue.  Respiratory: Negative for shortness of breath.   Cardiovascular: Negative for chest pain and leg swelling.  Gastrointestinal: Negative for abdominal pain.  Genitourinary: Positive for pelvic pain (cramping).      Objective:    BP 107/69 mmHg  Pulse 69  Ht 5\' 9"  (1.753 m)  Wt 268 lb (121.564 kg)  BMI 39.56 kg/m2  LMP 03/08/2014 Physical Exam  Constitutional: She is oriented to person, place, and time. She appears well-developed and well-nourished. No distress.  HENT:  Head: Normocephalic and atraumatic.  Eyes: No scleral icterus.  Neck: Neck supple.  Cardiovascular: Normal rate.   Pulmonary/Chest: Effort normal.  Abdominal: Soft.  Neurological: She is alert and oriented to person, place, and time.  Skin: Skin is warm and dry.  Psychiatric: She has a normal mood and affect.        Assessment & Plan:   Problem List Items Addressed This Visit      Unprioritized   Oligomenorrhea    Take provera if no cycle again in 4-6 wks.    Relevant Medications      medroxyPROGESTERone (PROVERA) tablet   Menorrhagia - Primary    Megace to stop bleeding.     Relevant Medications      megestrol (MEGACE) tablet       Return in about 3 months (around 07/01/2014).

## 2014-03-31 NOTE — Assessment & Plan Note (Signed)
Take provera if no cycle again in 4-6 wks.

## 2014-03-31 NOTE — Assessment & Plan Note (Addendum)
Megace to stop bleeding 

## 2014-03-31 NOTE — Patient Instructions (Signed)

## 2014-04-01 ENCOUNTER — Ambulatory Visit: Payer: BC Managed Care – PPO | Admitting: Obstetrics & Gynecology

## 2014-05-28 ENCOUNTER — Telehealth: Payer: Self-pay

## 2014-06-10 ENCOUNTER — Ambulatory Visit: Payer: Self-pay | Admitting: Family Medicine

## 2014-06-18 NOTE — Telephone Encounter (Signed)
Error

## 2014-06-29 ENCOUNTER — Emergency Department: Payer: Self-pay | Admitting: Emergency Medicine

## 2014-07-01 DIAGNOSIS — N200 Calculus of kidney: Secondary | ICD-10-CM | POA: Insufficient documentation

## 2014-09-06 NOTE — H&P (Signed)
PATIENT NAME:  Ernst BowlerBROOKS, Rubina A MR#:  [id number] DATE OF BIRTH:  21-Jul-1980  DATE OF ADMISSION:  10/23/2011  CHIEF COMPLAINT: Flank pain due to kidney stone.   HISTORY OF PRESENT ILLNESS: Mrs. Rhonda Rodgers is a 34 year old Caucasian female with a one-week history of urgency and frequency and a three day history of left flank pain. She went to the emergency room 10/17/2011 and was found to have a 3 mm distal left ureteral stone with hydronephrosis. She has failed to pass the stone and comes in now for ureteroscopic ureterolithotomy with holmium laser lithotripsy.   ALLERGIES: No known drug allergies.   CURRENT MEDICATIONS: Percocet, Flomax, and Zofran.   PAST SURGICAL HISTORY:  1. 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 2. Cholecystectomy, in 2003.   SOCIAL HISTORY: The patient denied tobacco or alcohol use.   FAMILY HISTORY: Remarkable for parents with breast cancer, diabetes, and hypertension.  PAST AND CURRENT MEDICAL CONDITIONS:  1. 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  2. Obesity.  REVIEW OF SYSTEMS: The patient denied chest pain, shortness of breath, diabetes, stroke, or heart disease.   PHYSICAL EXAMINATION:   GENERAL: Obese white female in no distress.   HEENT: Sclerae were clear. Pupils were equal and reactive to light. Extraocular movements were intact.   NECK: Supple. No palpable cervical adenopathy.   LUNGS: Clear to auscultation.   HEART: Regular rhythm and rate without audible murmurs.   ABDOMEN: Soft and nontender abdomen. No flank tenderness.   GU/RECTAL: Examination deferred.  NEUROMUSCULAR: Examination is nonfocal.   IMPRESSION: Left ureterolithiasis.   PLAN: Left ureteroscopic ureterolithotomy with holmium laser lithotripsy.  ____________________________ Suszanne ConnersMichael R. Evelene CroonWolff, MD mrw:slb D: 10/20/2011 11:22:25 ET T: 10/20/2011 11:39:42 ET JOB#: [id number]  cc: Suszanne ConnersMichael R. Evelene CroonWolff, MD, <Dictator> Orson ApeMICHAEL R Heidi Maclin  MD ELECTRONICALLY SIGNED 10/21/2011 11:01

## 2014-09-06 NOTE — Op Note (Signed)
PATIENT NAME:  Rhonda BowlerBROOKS, [name] MR#:  [id number] DATE OF BIRTH:  01/17/1981  DATE OF PROCEDURE:  10/23/2011  PREOPERATIVE DIAGNOSIS: Left ureterolithiasis.   POSTOPERATIVE DIAGNOSIS: Passage of left ureteral stone.   PROCEDURES:  1. Left ureteroscopy.  2. Fluoroscopy.   SURGEON: Suszanne ConnersMichael R. Evelene CroonWolff, MD  ANESTHETIST: Maisie Fushomas.   ANESTHETIC METHOD: General per Dr. Maisie Fushomas and local per Dr. Evelene CroonWolff.   INDICATIONS: See the dictated history and physical. After informed consent, patient requests above procedures.   OPERATIVE SUMMARY: After adequate general anesthesia had been obtained, patient was placed into dorsal lithotomy position and the perineum was prepped and draped in the usual fashion. Fluoroscopy revealed a calcification in the region of the left pelvis. At this point the mini rigid ureteroscope was coupled with a camera and then advanced into the left ureter. The ureteroscope was easily passed up to the level of the renal pelvis and no stones, tumors or obstructive lesions were identified. At this point, the ureteroscope was removed. 10 mL of viscous Xylocaine was instilled within the urethra and the bladder. The procedure was then terminated and the patient was transferred to the recovery room in stable condition.    ____________________________ Suszanne ConnersMichael R. Evelene CroonWolff, MD mrw:cms D: 10/23/2011 08:22:32 ET T: 10/23/2011 11:01:51 ET JOB#: [id number]  cc: Suszanne ConnersMichael R. Evelene CroonWolff, MD, <Dictator> Orson ApeMICHAEL R Fabianna Keats MD ELECTRONICALLY SIGNED 10/23/2011 13:11

## 2014-09-09 ENCOUNTER — Telehealth: Payer: Self-pay | Admitting: *Deleted

## 2014-09-09 DIAGNOSIS — N921 Excessive and frequent menstruation with irregular cycle: Secondary | ICD-10-CM

## 2014-09-09 MED ORDER — MEGESTROL ACETATE 40 MG PO TABS: 40.0000 mg | ORAL_TABLET | Freq: Three times a day (TID) | ORAL | Status: DC

## 2014-09-09 NOTE — Telephone Encounter (Signed)
Patient called and needs something for bleeding. I have called in more Megace for patient to take until we can get her an appointment with Dr. Shawnie PonsPratt.

## 2014-09-22 ENCOUNTER — Encounter: Payer: Self-pay | Admitting: Family Medicine

## 2014-09-22 ENCOUNTER — Ambulatory Visit (INDEPENDENT_AMBULATORY_CARE_PROVIDER_SITE_OTHER): Payer: BLUE CROSS/BLUE SHIELD | Admitting: Family Medicine

## 2014-09-22 VITALS — BP 127/81 | HR 66 | Ht 69.0 in | Wt 271.7 lb

## 2014-09-22 DIAGNOSIS — N920 Excessive and frequent menstruation with regular cycle: Secondary | ICD-10-CM | POA: Diagnosis not present

## 2014-09-22 DIAGNOSIS — Z113 Encounter for screening for infections with a predominantly sexual mode of transmission: Secondary | ICD-10-CM

## 2014-09-22 MED ORDER — DOXYCYCLINE HYCLATE 100 MG PO CAPS: 100.0000 mg | ORAL_CAPSULE | Freq: Two times a day (BID) | ORAL | Status: DC

## 2014-09-22 MED ORDER — MEGESTROL ACETATE 40 MG PO TABS: 40.0000 mg | ORAL_TABLET | Freq: Two times a day (BID) | ORAL | Status: DC

## 2014-09-22 NOTE — Assessment & Plan Note (Signed)
Continued bleeding without a clear source and normal TSH and U/S (per patient). She is currently uninterested in more permanent solutions like endometrial ablation or hysterectomy. Will try 10 d course of Doxycycline for presumed endometritis and more Megace.  If no improvement would do endometrial biopsy next.  [facility]

## 2014-09-22 NOTE — Progress Notes (Signed)
    Subjective:    Patient ID: Rhonda Rodgers is a 34 y.o. female presenting with Metrorrhagia  on 09/22/2014  HPI: Had normal cycle in Feb. And in Mar. She reports previous oligomenorrhea of no cycle x 4 months, then bled x 1 month. Responded to Megace. Then began cycle in April and has had continued bleeding since 4/17. Has had some unprotected intercourse and desires to be tested. Has normal TSH in 7/15, and normal pelvic u/s in 1/16 at [facility]--although I cannot see results of this today. Has tried Megace 3x/day and it has not stopped. She is s/p BTL. She has previously failed IUD due to a lot of cramping.   Review of Systems  Constitutional: Positive for fatigue. Negative for fever and chills.  Respiratory: Negative for shortness of breath.   Cardiovascular: Negative for chest pain.  Gastrointestinal: Negative for nausea, vomiting and abdominal pain.  Genitourinary: Negative for dysuria.  Skin: Negative for rash.      Objective:    BP 127/81 mmHg  Pulse 66  Ht 5\' 9"  (1.753 m)  Wt 271 lb 11.2 oz (123.242 kg)  BMI 40.10 kg/m2 Physical Exam  Constitutional: She is oriented to person, place, and time. She appears well-developed and well-nourished. No distress.  HENT:  Head: Normocephalic and atraumatic.  Eyes: No scleral icterus.  Neck: Neck supple.  Cardiovascular: Normal rate.   Pulmonary/Chest: Effort normal.  Abdominal: Soft.  Neurological: She is alert and oriented to person, place, and time.  Skin: Skin is warm and dry.  Psychiatric: She has a normal mood and affect.        Assessment & Plan:   Problem List Items Addressed This Visit      Unprioritized   Menorrhagia    Continued bleeding without a clear source and normal TSH and U/S (per patient). She is currently uninterested in more permanent solutions like endometrial ablation or hysterectomy. Will try 10 d course of Doxycycline for presumed endometritis and more Megace.  If no improvement would do  endometrial biopsy next.  [facility]      Relevant Medications   doxycycline (VIBRAMYCIN) 100 MG capsule   megestrol (MEGACE) 40 MG tablet    Other Visit Diagnoses    Screen for STD (sexually transmitted disease)    -  Primary    Relevant Orders    RPR    Hepatitis C antibody    Hepatitis B surface antigen    HIV antibody    GC/Chlamydia Probe Amp        Return in about 3 months (around 12/23/2014).  PRATT,TANYA S 09/22/2014 8:42 AM

## 2014-09-22 NOTE — Patient Instructions (Signed)

## 2014-09-23 LAB — GC/CHLAMYDIA PROBE AMP
CT Probe RNA: NEGATIVE
GC Probe RNA: NEGATIVE

## 2014-09-23 LAB — HEPATITIS B SURFACE ANTIGEN: Hepatitis B Surface Ag: NEGATIVE

## 2014-09-23 LAB — HEPATITIS C ANTIBODY: HCV Ab: NEGATIVE

## 2014-09-23 LAB — HIV ANTIBODY (ROUTINE TESTING W REFLEX): HIV 1&2 Ab, 4th Generation: NONREACTIVE

## 2014-09-23 LAB — RPR

## 2014-11-10 ENCOUNTER — Ambulatory Visit: Payer: BLUE CROSS/BLUE SHIELD | Admitting: Family Medicine

## 2014-11-11 ENCOUNTER — Encounter: Payer: Self-pay | Admitting: *Deleted

## 2014-11-13 HISTORY — PX: OTHER SURGICAL HISTORY: SHX169

## 2014-12-01 ENCOUNTER — Emergency Department
Admission: EM | Admit: 2014-12-01 | Discharge: 2014-12-01 | Disposition: A | Discharge status: Home / Self Care | Payer: BLUE CROSS/BLUE SHIELD | Attending: Emergency Medicine | Admitting: Emergency Medicine

## 2014-12-01 ENCOUNTER — Encounter: Payer: Self-pay | Admitting: *Deleted

## 2014-12-01 ENCOUNTER — Emergency Department: Payer: BLUE CROSS/BLUE SHIELD

## 2014-12-01 DIAGNOSIS — Z79899 Other long term (current) drug therapy: Secondary | ICD-10-CM | POA: Insufficient documentation

## 2014-12-01 DIAGNOSIS — R109 Unspecified abdominal pain: Secondary | ICD-10-CM | POA: Diagnosis present

## 2014-12-01 DIAGNOSIS — N200 Calculus of kidney: Secondary | ICD-10-CM | POA: Diagnosis not present

## 2014-12-01 LAB — COMPREHENSIVE METABOLIC PANEL
ALK PHOS: 64 U/L (ref 38–126)
ALT: 23 U/L (ref 14–54)
AST: 19 U/L (ref 15–41)
Albumin: 3.4 g/dL — ABNORMAL LOW (ref 3.5–5.0)
Anion gap: 6 (ref 5–15)
BUN: 16 mg/dL (ref 6–20)
CALCIUM: 8.1 mg/dL — AB (ref 8.9–10.3)
CHLORIDE: 107 mmol/L (ref 101–111)
CO2: 24 mmol/L (ref 22–32)
CREATININE: 1.09 mg/dL — AB (ref 0.44–1.00)
GFR calc non Af Amer: >60 mL/min (ref >60)
Glucose, Bld: 115 mg/dL — ABNORMAL HIGH (ref 65–99)
Potassium: 3.3 mmol/L — ABNORMAL LOW (ref 3.5–5.1)
SODIUM: 137 mmol/L (ref 135–145)
TOTAL PROTEIN: 6.4 g/dL — AB (ref 6.5–8.1)
Total Bilirubin: 0.3 mg/dL (ref 0.3–1.2)

## 2014-12-01 LAB — URINALYSIS COMPLETE WITH MICROSCOPIC (ARMC ONLY)
BILIRUBIN URINE: NEGATIVE
GLUCOSE, UA: NEGATIVE mg/dL
Ketones, ur: NEGATIVE mg/dL
Leukocytes, UA: NEGATIVE
Nitrite: NEGATIVE
PH: 5 (ref 5.0–8.0)
PROTEIN: 30 mg/dL — AB
Specific Gravity, Urine: 1.023 (ref 1.005–1.030)

## 2014-12-01 LAB — CBC
HEMATOCRIT: 35.2 % (ref 35.0–47.0)
Hemoglobin: 11.6 g/dL — ABNORMAL LOW (ref 12.0–16.0)
MCH: 26 pg (ref 26.0–34.0)
MCHC: 32.9 g/dL (ref 32.0–36.0)
MCV: 79.2 fL — AB (ref 80.0–100.0)
PLATELETS: 200 10*3/uL (ref 150–440)
RBC: 4.45 MIL/uL (ref 3.80–5.20)
RDW: 15.1 % — AB (ref 11.5–14.5)
WBC: 8.7 10*3/uL (ref 3.6–11.0)

## 2014-12-01 LAB — LIPASE, BLOOD: LIPASE: 16 U/L — AB (ref 22–51)

## 2014-12-01 LAB — TROPONIN I: Troponin I: <0.03 ng/mL (ref <0.031)

## 2014-12-01 MED ORDER — SODIUM CHLORIDE 0.9 % IV BOLUS (SEPSIS)
1000.0000 mL | Freq: Once | INTRAVENOUS | Status: AC
  Administered 2014-12-01: 1000 mL via INTRAVENOUS

## 2014-12-01 MED ORDER — TRAMADOL HCL 50 MG PO TABS
50.0000 mg | ORAL_TABLET | Freq: Once | ORAL | Status: AC
  Administered 2014-12-01: 50 mg via ORAL
  Filled 2014-12-01: qty 1

## 2014-12-01 MED ORDER — TAMSULOSIN HCL 0.4 MG PO CAPS: 0.4000 mg | ORAL_CAPSULE | Freq: Every day | ORAL | Status: DC

## 2014-12-01 MED ORDER — ONDANSETRON 4 MG PO TBDP: 4.0000 mg | ORAL_TABLET | Freq: Three times a day (TID) | ORAL | Status: DC | PRN

## 2014-12-01 MED ORDER — KETOROLAC TROMETHAMINE 30 MG/ML IJ SOLN
30.0000 mg | Freq: Once | INTRAMUSCULAR | Status: AC
  Administered 2014-12-01: 30 mg via INTRAVENOUS
  Filled 2014-12-01: qty 1

## 2014-12-01 MED ORDER — ONDANSETRON HCL 4 MG/2ML IJ SOLN
INTRAMUSCULAR | Status: AC
  Administered 2014-12-01: 4 mg
  Filled 2014-12-01: qty 2

## 2014-12-01 MED ORDER — ONDANSETRON HCL 4 MG/2ML IJ SOLN: 4.0000 mg | Freq: Once | INTRAMUSCULAR | Status: DC

## 2014-12-01 MED ORDER — OXYCODONE-ACETAMINOPHEN 5-325 MG PO TABS: 1.0000 | ORAL_TABLET | Freq: Four times a day (QID) | ORAL | Status: DC | PRN

## 2014-12-01 NOTE — ED Notes (Signed)
Patient discharged to home per MD order. Patient in stable condition, and deemed medically cleared by ED provider for discharge. Discharge instructions reviewed with patient/family using "Teach Back"; verbalized understanding of medication education and administration, and information about follow-up care. Denies further concerns. ° °

## 2014-12-01 NOTE — ED Provider Notes (Signed)
[facility] Emergency Department Provider Note  ____________________________________________  Time seen: Approximately 302 AM  I have reviewed the triage vital signs and the nursing notes.   HISTORY  Chief Complaint Flank Pain   HPI Rhonda Rodgers is a 34 y.o. female who reports that she's had multiple episodes of back pain in the last week. She reports that one week ago she had some left-sided back pain that she thought was a kidney stone but the symptoms resolved on its own. She reports that the pain returned 2 days ago and the pain again resolved but the patient did have an episode of emesis. She reports that tonight the pain started on her left side and it won't go away. She reports that she took some hydrocodone and it did not help. The patient reports that she also vomited today. The patient reports that she's had a kidney stone before but this pain feels worse. The patient reports the pain as a 10 out of 10 in intensity and is a sharp pain. He is also in the patient's left side and not in her left flank. The patient reports that she could not tolerate the pain so she decided to come in for further evaluation.   Past Medical History  Diagnosis Date  . 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 Asthma     childhood  . Umbilical hernia     Patient Active Problem List   Diagnosis Date Noted  . Calculus of kidney 07/01/2014  . Menorrhagia 03/31/2014  . Acid reflux 12/10/2013  . Oligomenorrhea 12/10/2013  . Breast lump 10/15/2012  . Pelvic pain in female 11/29/2010  . Obesity 11/29/2010    Past Surgical History  Procedure Laterality Date  . Cholecystectomy    . Herina excision  09/2010  . Umbilical hernia repair    . Tubal ligation    . Laproscopy  12/2010    pelvic pain  . Wisdom tooth extraction    . Laparoscopy  01/09/2011    Procedure: LAPAROSCOPY DIAGNOSTIC;  Surgeon: Reva Bores,  MD;  Location: [facility];  Service: Gynecology;  Laterality: N/A;  . Embolization  02/28/2011    pelvic congestion syndrome  . Insertion of mesh N/A 12/09/2012    Procedure: INSERTION OF MESH;  Surgeon: [name], MD;  Location: [facility];  Service: General;  Laterality: N/A;  . Ventral hernia repair  12/09/2012    Procedure: LAPAROSCOPIC REPAIR OF RECURRENT INCISIONAL VENTRAL HERNIA WITH MESH;  Surgeon: [name], MD;  Location: [facility];  Service: General;;    Current Outpatient Rx  Name  Route  Sig  Dispense  Refill  . doxycycline (VIBRAMYCIN) 100 MG capsule   Oral   Take 1 capsule (100 mg total) by mouth 2 (two) times daily.   20 capsule   0   . ibuprofen (ADVIL,MOTRIN) 200 MG tablet   Oral   Take 200 mg by mouth every 6 (six) hours as needed.         . megestrol (MEGACE) 40 MG tablet   Oral   Take 1 tablet (40 mg total) by mouth 2 (two) times daily.   90 tablet   3   . ondansetron (ZOFRAN ODT) 4 MG disintegrating tablet   Oral   Take 1 tablet (4 mg total) by mouth every 8 (eight) hours as needed for nausea or vomiting.   20 tablet  0   . oxyCODONE-acetaminophen (ROXICET) 5-325 MG per tablet   Oral   Take 1 tablet by mouth every 6 (six) hours as needed.   12 tablet   0   . tamsulosin (FLOMAX) 0.4 MG CAPS capsule   Oral   Take 1 capsule (0.4 mg total) by mouth daily.   5 capsule   0     Allergies Contrast media  Family History  Problem Relation Age of Onset  . Diabetes Mother   . Cancer Mother [id number]    breast  . Hypertension Father   . Cancer Maternal Uncle 51    liver / pancreatic  . Cancer Paternal Grandmother     lung  . Cancer Paternal Grandfather     Social History History  Substance Use Topics  . Smoking status: Never Smoker   . Smokeless tobacco: Never Used  . Alcohol Use: No    Review of Systems Constitutional: No fever/chills Eyes: No visual changes. ENT: No sore throat. Cardiovascular: Denies chest pain. Respiratory: Denies  shortness of breath. Gastrointestinal: No abdominal pain.  No nausea, no vomiting.  No diarrhea.  No constipation. Genitourinary: Negative for dysuria. Musculoskeletal: Left-sided pain Skin: Negative for rash. Neurological: Negative for headaches, focal weakness or numbness.  10-point ROS otherwise negative.  ____________________________________________   PHYSICAL EXAM:  VITAL SIGNS: ED Triage Vitals  Enc Vitals Group     BP 12/01/14 0156 131/91 mmHg     Pulse Rate 12/01/14 0154 89     Resp 12/01/14 0154 18     Temp 12/01/14 0154 98.4 F (36.9 C)     Temp Source 12/01/14 0154 Oral     SpO2 12/01/14 0154 97 %     Weight 12/01/14 0154 260 lb (117.935 kg)     Height 12/01/14 0154 5\' 9"  (1.753 m)     Head Cir --      Peak Flow --      Pain Score 12/01/14 0155 10     Pain Loc --      Pain Edu? --      Excl. in GC? --     Constitutional: Alert and oriented. Well appearing and in moderate distress. Eyes: Conjunctivae are normal. PERRL. EOMI. Head: Atraumatic. Nose: No congestion/rhinnorhea. Mouth/Throat: Mucous membranes are moist.  Oropharynx non-erythematous. Cardiovascular: Normal rate, regular rhythm. Grossly normal heart sounds.  Good peripheral circulation. Respiratory: Normal respiratory effort.  No retractions. Lungs CTAB. Gastrointestinal: Soft and tenderness to palpation of the left upper quadrant and left side. No distention. Positive bowel sounds No CVA tenderness. Genitourinary: Deferred Musculoskeletal: No lower extremity tenderness nor edema.   Neurologic:  Normal speech and language.  Skin:  Skin is warm, dry and intact.  Psychiatric: Mood and affect are normal.  ____________________________________________   LABS (all labs ordered are listed, but only abnormal results are displayed)  Labs Reviewed  URINALYSIS COMPLETEWITH MICROSCOPIC ([facility] ONLY) - Abnormal; Notable for the following:    Color, Urine YELLOW (*)    APPearance CLEAR (*)    Hgb urine  dipstick 3+ (*)    Protein, ur 30 (*)    Bacteria, UA RARE (*)    Squamous Epithelial / LPF 0-5 (*)    All other components within normal limits  CBC - Abnormal; Notable for the following:    Hemoglobin 11.6 (*)    MCV 79.2 (*)    RDW 15.1 (*)    All other components within normal limits  COMPREHENSIVE METABOLIC PANEL - Abnormal; Notable for  the following:    Potassium 3.3 (*)    Glucose, Bld 115 (*)    Creatinine, Ser 1.09 (*)    Calcium 8.1 (*)    Total Protein 6.4 (*)    Albumin 3.4 (*)    All other components within normal limits  LIPASE, BLOOD - Abnormal; Notable for the following:    Lipase 16 (*)    All other components within normal limits  TROPONIN I   ____________________________________________  EKG  None ____________________________________________  RADIOLOGY  CT renal stone study: Mild left-sided hydronephrosis with a relatively large obstructing 1.2 x 0.7 cm stone in the distal left ureter, 2 cm proximal to the left vesico ureteral junction, nonobstructing 3 mm stone at the upper pole of the right kidney ____________________________________________   PROCEDURES  Procedure(s) performed: None  Critical Care performed: No  ____________________________________________   INITIAL IMPRESSION / ASSESSMENT AND PLAN / ED COURSE  Pertinent labs & imaging results that were available during my care of the patient were reviewed by me and considered in my medical decision making (see chart for details).  The patient is a 34 year old female who comes in with some left-sided flank pain today. The patient did bring her children and does not have a someone who can drive her home. Did give the patient a dose of Toradol 30 mg IV 1 L of normal saline and tramadol 50 mg orally. The patient reports that her pain did improve to where it's bearable but she does still have some pain. I informed her that unless we have someone who could pick her up and take her home we are unable  to give her narcotic medication while in the emergency department. I informed the patient that she does need a follow-up with urology as she likely will need a lithotripsy. She understands and is willing to go home with oral pain medicine. The patient will be discharged home to follow-up with urology for possible lithotripsy due to her large kidney stone. ____________________________________________   FINAL CLINICAL IMPRESSION(S) / ED DIAGNOSES  Final diagnoses:  Left sided abdominal pain  Kidney stone on left side      Rebecka Apley, MD 12/01/14 0700

## 2014-12-01 NOTE — ED Notes (Signed)
Report received from Kelly G. RN. Patient care assumed. Patient/RN introduction complete. Will continue to monitor.  

## 2014-12-01 NOTE — ED Notes (Signed)
Pt to ct scan.

## 2014-12-01 NOTE — ED Notes (Signed)
Pt in with co left flank pain for about 1 week, states hx of kidney stones.  Does have urinary urgency and frequency.

## 2014-12-01 NOTE — Discharge Instructions (Signed)
Flank Pain °Flank pain refers to pain that is located on the side of the body between the upper abdomen and the back. The pain may occur over a short period of time (acute) or may be long-term or reoccurring (chronic). It may be mild or severe. Flank pain can be caused by many things. °CAUSES  °Some of the more common causes of flank pain include: °· Muscle strains.   °· Muscle spasms.   °· A disease of your spine (vertebral disk disease).   °· A lung infection (pneumonia).   °· Fluid around your lungs (pulmonary edema).   °· A kidney infection.   °· Kidney stones.   °· A very painful skin rash caused by the chickenpox virus (shingles).   °· Gallbladder disease.   °HOME CARE INSTRUCTIONS  °Home care will depend on the cause of your pain. In general, °· Rest as directed by your caregiver. °· Drink enough fluids to keep your urine clear or pale yellow. °· Only take over-the-counter or prescription medicines as directed by your caregiver. Some medicines may help relieve the pain. °· Tell your caregiver about any changes in your pain. °· Follow up with your caregiver as directed. °SEEK IMMEDIATE MEDICAL CARE IF:  °· Your pain is not controlled with medicine.   °· You have new or worsening symptoms. °· Your pain increases.   °· You have abdominal pain.   °· You have shortness of breath.   °· You have persistent nausea or vomiting.   °· You have swelling in your abdomen.   °· You feel faint or pass out.   °· You have blood in your urine. °· You have a fever or persistent symptoms for more than 2-3 days. °· You have a fever and your symptoms suddenly get worse. °MAKE SURE YOU:  °· Understand these instructions. °· Will watch your condition. °· Will get help right away if you are not doing well or get worse. °Document Released: 06/22/2005 Document Revised: 01/24/2012 Document Reviewed: 12/14/2011 °ExitCare® Patient Information ©2015 [employer]. This information is not intended to replace advice given to you by your  health care provider. Make sure you discuss any questions you have with your health care provider. ° °Kidney Stones °Kidney stones (urolithiasis) are deposits that form inside your kidneys. The intense pain is caused by the stone moving through the urinary tract. When the stone moves, the ureter goes into spasm around the stone. The stone is usually passed in the urine.  °CAUSES  °· A disorder that makes certain neck glands produce too much parathyroid hormone (primary hyperparathyroidism). °· A buildup of uric acid crystals, similar to gout in your joints. °· Narrowing (stricture) of the ureter. °· A kidney obstruction present at birth (congenital obstruction). °· Previous surgery on the kidney or ureters. °· Numerous kidney infections. °SYMPTOMS  °· Feeling sick to your stomach (nauseous). °· Throwing up (vomiting). °· Blood in the urine (hematuria). °· Pain that usually spreads (radiates) to the groin. °· Frequency or urgency of urination. °DIAGNOSIS  °· Taking a history and physical exam. °· Blood or urine tests. °· CT scan. °· Occasionally, an examination of the inside of the urinary bladder (cystoscopy) is performed. °TREATMENT  °· Observation. °· Increasing your fluid intake. °· Extracorporeal shock wave lithotripsy--This is a noninvasive procedure that uses shock waves to break up kidney stones. °· Surgery may be needed if you have severe pain or persistent obstruction. There are various surgical procedures. Most of the procedures are performed with the use of small instruments. Only small incisions are needed to accommodate these instruments,   so recovery time is minimized. °The size, location, and chemical composition are all important variables that will determine the proper choice of action for you. Talk to your health care provider to better understand your situation so that you will minimize the risk of injury to yourself and your kidney.  °HOME CARE INSTRUCTIONS  °· Drink enough water and fluids to  keep your urine clear or pale yellow. This will help you to pass the stone or stone fragments. °· Strain all urine through the provided strainer. Keep all particulate matter and stones for your health care provider to see. The stone causing the pain may be as small as a grain of salt. It is very important to use the strainer each and every time you pass your urine. The collection of your stone will allow your health care provider to analyze it and verify that a stone has actually passed. The stone analysis will often identify what you can do to reduce the incidence of recurrences. °· Only take over-the-counter or prescription medicines for pain, discomfort, or fever as directed by your health care provider. °· Make a follow-up appointment with your health care provider as directed. °· Get follow-up X-rays if required. The absence of pain does not always mean that the stone has passed. It may have only stopped moving. If the urine remains completely obstructed, it can cause loss of kidney function or even complete destruction of the kidney. It is your responsibility to make sure X-rays and follow-ups are completed. Ultrasounds of the kidney can show blockages and the status of the kidney. Ultrasounds are not associated with any radiation and can be performed easily in a matter of minutes. °SEEK MEDICAL CARE IF: °· You experience pain that is progressive and unresponsive to any pain medicine you have been prescribed. °SEEK IMMEDIATE MEDICAL CARE IF:  °· Pain cannot be controlled with the prescribed medicine. °· You have a fever or shaking chills. °· The severity or intensity of pain increases over 18 hours and is not relieved by pain medicine. °· You develop a new onset of abdominal pain. °· You feel faint or pass out. °· You are unable to urinate. °MAKE SURE YOU:  °· Understand these instructions. °· Will watch your condition. °· Will get help right away if you are not doing well or get worse. °Document Released:  05/01/2005 Document Revised: 01/01/2013 Document Reviewed: 10/02/2012 °ExitCare® Patient Information ©2015 [employer]. This information is not intended to replace advice given to you by your health care provider. Make sure you discuss any questions you have with your health care provider. ° °

## 2014-12-02 ENCOUNTER — Other Ambulatory Visit: Payer: Self-pay | Admitting: Urology

## 2014-12-02 ENCOUNTER — Encounter (HOSPITAL_COMMUNITY): Payer: Self-pay | Admitting: *Deleted

## 2014-12-03 ENCOUNTER — Ambulatory Visit (HOSPITAL_COMMUNITY): Payer: BLUE CROSS/BLUE SHIELD

## 2014-12-03 ENCOUNTER — Encounter (HOSPITAL_COMMUNITY): Payer: Self-pay | Admitting: *Deleted

## 2014-12-03 ENCOUNTER — Encounter (HOSPITAL_COMMUNITY)
Admission: RE | Disposition: A | Discharge status: Home / Self Care | Payer: Self-pay | Source: Ambulatory Visit | Attending: Urology

## 2014-12-03 ENCOUNTER — Ambulatory Visit (HOSPITAL_COMMUNITY)
Admission: RE | Admit: 2014-12-03 | Discharge: 2014-12-03 | Disposition: A | Discharge status: Home / Self Care | Payer: BLUE CROSS/BLUE SHIELD | Source: Ambulatory Visit | Attending: Urology | Admitting: Urology

## 2014-12-03 DIAGNOSIS — M545 Low back pain: Secondary | ICD-10-CM | POA: Diagnosis not present

## 2014-12-03 DIAGNOSIS — R109 Unspecified abdominal pain: Secondary | ICD-10-CM | POA: Diagnosis not present

## 2014-12-03 DIAGNOSIS — E669 Obesity, unspecified: Secondary | ICD-10-CM | POA: Insufficient documentation

## 2014-12-03 DIAGNOSIS — Z87442 Personal history of urinary calculi: Secondary | ICD-10-CM | POA: Insufficient documentation

## 2014-12-03 DIAGNOSIS — J45909 Unspecified asthma, uncomplicated: Secondary | ICD-10-CM | POA: Diagnosis not present

## 2014-12-03 DIAGNOSIS — R112 Nausea with vomiting, unspecified: Secondary | ICD-10-CM | POA: Diagnosis not present

## 2014-12-03 DIAGNOSIS — N132 Hydronephrosis with renal and ureteral calculous obstruction: Secondary | ICD-10-CM | POA: Diagnosis not present

## 2014-12-03 DIAGNOSIS — Z91041 Radiographic dye allergy status: Secondary | ICD-10-CM | POA: Insufficient documentation

## 2014-12-03 DIAGNOSIS — Z6839 Body mass index (BMI) 39.0-39.9, adult: Secondary | ICD-10-CM | POA: Diagnosis not present

## 2014-12-03 DIAGNOSIS — N201 Calculus of ureter: Secondary | ICD-10-CM | POA: Diagnosis present

## 2014-12-03 HISTORY — DX: Personal history of urinary calculi: Z87.442

## 2014-12-03 SURGERY — LITHOTRIPSY, ESWL
Anesthesia: LOCAL | Laterality: Left

## 2014-12-03 MED ORDER — CIPROFLOXACIN HCL 500 MG PO TABS
500.0000 mg | ORAL_TABLET | ORAL | Status: AC
  Administered 2014-12-03: 500 mg via ORAL
  Filled 2014-12-03: qty 1

## 2014-12-03 MED ORDER — DIPHENHYDRAMINE HCL 25 MG PO CAPS
25.0000 mg | ORAL_CAPSULE | ORAL | Status: AC
  Administered 2014-12-03: 25 mg via ORAL
  Filled 2014-12-03: qty 1

## 2014-12-03 MED ORDER — SODIUM CHLORIDE 0.9 % IV SOLN
INTRAVENOUS | Status: DC
  Administered 2014-12-03: 09:00:00 via INTRAVENOUS

## 2014-12-03 MED ORDER — DIAZEPAM 5 MG PO TABS
10.0000 mg | ORAL_TABLET | ORAL | Status: AC
  Administered 2014-12-03: 10 mg via ORAL
  Filled 2014-12-03: qty 2

## 2014-12-03 NOTE — H&P (Signed)
History of Present Illness   I was consulted by Dr Zenda Alpers regarding Rhonda Rodgers left ureteral stone. Off and on she has had back pain in the last week. She describes a past stone with likely a negative ureteroscopy in the past. She is having left lower quadrant and left low back pain associated with nausea, and she is having vomiting. One Vicodin at home did not hold her, and she went to the emergency room and had Toradol, which helped some. She said the oxycodone helped but caused some nausea as well. She has not noticed any blood in the urine. She has noticed urgency. She was found to have a 1.2 x 7 mm stone in the distal left ureter with mild left-sided hydronephrosis and some tortuosity distally. She had a 3 mm nonobstructing stone in the upper pole of the right kidney.  There are no other modifying factors or associated signs or symptoms. There are no other aggravating or relieving factors. The presentation is moderate in severity and ongoing.  I reviewed her medical records, and she was given tamsulosin 5 capsules and 12 oxycodone and 8 Zofran.    Past Medical History Problems  1. History of asthma (Z87.09)  Surgical History Problems  1. History of Cholecystectomy 2. History of Tubal Ligation  Current Meds 1. Doxycycline Hyclate 100 MG Oral Capsule;  Therapy: (Recorded:20Jul2016) to Recorded 2. Ibuprofen 200 MG Oral Tablet;  Therapy: (Recorded:20Jul2016) to Recorded 3. Megestrol Acetate 40 MG Oral Tablet;  Therapy: (Recorded:20Jul2016) to Recorded 4. OxyCODONE HCl TABS;  Therapy: (Recorded:20Jul2016) to Recorded 5. Tamsulosin HCl - 0.4 MG Oral Capsule;  Therapy: (Recorded:20Jul2016) to Recorded  Allergies Medication  1. Contrast Media Ready-Box MISC  Family History Problems  1. Family history of kidney stones (Z84.1) : Brother  Social History Problems    Denied: History of Alcohol use   Daily caffeine consumption   2 per day   Married   Never a smoker    Consulting civil engineer   Three children   1 son and 2 daughters  Review of Systems Constitutional, skin, eye, otolaryngeal, hematologic/lymphatic, cardiovascular, pulmonary, endocrine, neurological and psychiatric system(s) were reviewed and pertinent findings if present are noted and are otherwise negative.  Genitourinary: urinary urgency.  Gastrointestinal: nausea and vomiting.  Musculoskeletal: back pain.    Vitals Vital Signs [Data Includes: Last 1 Day]  Recorded: 20Jul2016 10:40AM  Height: 5 ft 9 in Weight: 261 lb  BMI Calculated: 38.54 BSA Calculated: 2.31 Blood Pressure: 127 / 89, Sitting Temperature: 98.5 F, Oral Heart Rate: 85 Respiration: 16  Physical Exam Constitutional: Well nourished and well developed . No acute distress.   ENT:. The ears and nose are normal in appearance.   Neck: The appearance of the neck is normal and no neck mass is present.   Pulmonary: No respiratory distress and normal respiratory rhythm and effort.   Cardiovascular: Heart rate and rhythm are normal . No peripheral edema.   Lymphatics: The femoral and inguinal nodes are not enlarged or tender.   Skin: Normal skin turgor, no visible rash and no visible skin lesions.   Neuro/Psych:. Mood and affect are appropriate.   . Abdomen:   No mass or tenderness.   . Genitourinary:    Rhonda Rodgers was not toxic. She did not have CVA tenderness. She did look a little bit uncomfortable.    Assessment Assessed  1. Nephrolithiasis (N20.0)  Plan Nephrolithiasis  1. Start: Hydrocodone-Acetaminophen 5-325 MG Oral Tablet; Take 1 tablet every 4 - 6 hours  as needed 2. Start: Ondansetron HCl - 4 MG Oral Tablet (Zofran); TAKE 1 TABLET Every 6 hours PRN 3. Follow-up Schedule Surgery Office  Follow-up  Status: Complete  Done: 20Jul2016  Discussion/Summary   Rhonda Rodgers has a symptomatic large distal left ureteral stone associated with urinary urgency. She is allergic to IVP dye.   Rhonda Rodgers is looking a  little bit better, though, she is still uncomfortable. I drew her a picture. I reviewed her KUB with Dr Mena Goes. I truly believe that the shadow noted at the left ischial spine is her stone. It did not look that calcified. She has been more than 24 hours already having Toradol and was discharged from the hospital yesterday morning at 7 a.m. I had reviewed a stent and future ureteroscopy with her. I went over lithotripsy in detail.  We talked about ESWL in detail. Pros, cons, general surgical and anesthetic risks, and other options including watchful waiting and ureteroscopy were discussed. Success and failure rates and need for further/repeat therapy were discussed. Risks were described but not limited to pain, infection, sepsis, and bleeding. The risk of renal and ureteral trauma with short and long term sequelae was discussed. The risk of injury to adjacent structures was discussed. The risk of needing a stent post-ESWL was discussed.  I am going to give her 20 more 4 mg Zofran and 40 more Percocet. She will Rodgers on her tamsulosin. I relatively decreased success rates based upon the size of the stone, position of the stone, and mild to moderate obesity. Pros and cons and risks and success rate of other procedures discussed.     Signatures Electronically signed by : Alfredo Martinez, M.D.; Dec 03 2014  6:27AM EST

## 2014-12-03 NOTE — Interval H&P Note (Signed)
History and Physical Interval Note:  12/03/2014 10:33 AM  Rhonda Rodgers  has presented today for surgery, with the diagnosis of LEFT DISTAL URETERAL STONE  The various methods of treatment have been discussed with the patient and family. After consideration of risks, benefits and other options for treatment, the patient has consented to  Procedure(s): LEFT EXTRACORPOREAL SHOCK WAVE LITHOTRIPSY (ESWL) (Left) as a surgical intervention .  The patient's history has been reviewed, patient examined, no change in status, stable for surgery.  I have reviewed the patient's chart and labs.  Questions were answered to the patient's satisfaction.     [name]

## 2014-12-03 NOTE — Discharge Instructions (Signed)
See [facility] discharge instructions in chart. ° ° ° ° °Conscious Sedation, Adult, Care After °Refer to this sheet in the next few weeks. These instructions provide you with information on caring for yourself after your procedure. Your health care provider may also give you more specific instructions. Your treatment has been planned according to current medical practices, but problems sometimes occur. Call your health care provider if you have any problems or questions after your procedure. °WHAT TO EXPECT AFTER THE PROCEDURE  °After your procedure: °· You may feel sleepy, clumsy, and have poor balance for several hours. °· Vomiting may occur if you eat too soon after the procedure. °HOME CARE INSTRUCTIONS °· Do not participate in any activities where you could become injured for at least 24 hours. Do not: °¨ Drive. °¨ Swim. °¨ Ride a bicycle. °¨ Operate heavy machinery. °¨ Cook. °¨ Use power tools. °¨ Climb ladders. °¨ Work from a high place. °· Do not make important decisions or sign legal documents until you are improved. °· If you vomit, drink water, juice, or soup when you can drink without vomiting. Make sure you have little or no nausea before eating solid foods. °· Only take over-the-counter or prescription medicines for pain, discomfort, or fever as directed by your health care provider. °· Make sure you and your family fully understand everything about the medicines given to you, including what side effects may occur. °· You should not drink alcohol, take sleeping pills, or take medicines that cause drowsiness for at least 24 hours. °· If you smoke, do not smoke without supervision. °· If you are feeling better, you may resume normal activities 24 hours after you were sedated. °· Keep all appointments with your health care provider. °SEEK MEDICAL CARE IF: °· Your skin is pale or bluish in color. °· You continue to feel nauseous or vomit. °· Your pain is getting worse and is not helped by  medicine. °· You have bleeding or swelling. °· You are still sleepy or feeling clumsy after 24 hours. °SEEK IMMEDIATE MEDICAL CARE IF: °· You develop a rash. °· You have difficulty breathing. °· You develop any type of allergic problem. °· You have a fever. °MAKE SURE YOU: °· Understand these instructions. °· Will watch your condition. °· Will get help right away if you are not doing well or get worse. °Document Released: 02/19/2013 Document Reviewed: 02/19/2013 °Exit[employer]® Patient Information ©2015 [employer]. This information is not intended to replace advice given to you by your health care provider. Make sure you discuss any questions you have with your health care provider. ° °

## 2014-12-03 NOTE — Op Note (Signed)
See [facility] note scanned into chart. 

## 2014-12-14 ENCOUNTER — Ambulatory Visit: Payer: Self-pay | Admitting: Urology

## 2015-02-09 ENCOUNTER — Telehealth: Payer: Self-pay | Admitting: *Deleted

## 2015-02-09 MED ORDER — METRONIDAZOLE 500 MG PO TABS: 500.0000 mg | ORAL_TABLET | Freq: Two times a day (BID) | ORAL | Status: DC

## 2015-02-09 NOTE — Telephone Encounter (Signed)
-----   Message from Olevia Bowens, [place] sent at 02/09/2015  2:53 PM EDT ----- Regarding: RX Contact: [phone] Patient is sure she has BV, would like something called in, has appt schedule for 10/4 for pap/irrg. bleeding

## 2015-02-09 NOTE — Telephone Encounter (Signed)
Pt has hx of BV, c/o discharge with foul smelling odor consistent with BV.  Sent rx to pharmacy per protocol, informed pt of directions of use.

## 2015-02-16 ENCOUNTER — Ambulatory Visit (INDEPENDENT_AMBULATORY_CARE_PROVIDER_SITE_OTHER): Payer: BLUE CROSS/BLUE SHIELD | Admitting: Family Medicine

## 2015-02-16 ENCOUNTER — Encounter: Payer: Self-pay | Admitting: Family Medicine

## 2015-02-16 VITALS — BP 118/80 | HR 79 | Ht 69.0 in | Wt 281.0 lb

## 2015-02-16 DIAGNOSIS — Z01419 Encounter for gynecological examination (general) (routine) without abnormal findings: Secondary | ICD-10-CM

## 2015-02-16 DIAGNOSIS — N920 Excessive and frequent menstruation with regular cycle: Secondary | ICD-10-CM | POA: Diagnosis not present

## 2015-02-16 DIAGNOSIS — Z124 Encounter for screening for malignant neoplasm of cervix: Secondary | ICD-10-CM

## 2015-02-16 DIAGNOSIS — N912 Amenorrhea, unspecified: Secondary | ICD-10-CM

## 2015-02-16 DIAGNOSIS — Z1151 Encounter for screening for human papillomavirus (HPV): Secondary | ICD-10-CM | POA: Diagnosis not present

## 2015-02-16 DIAGNOSIS — Z23 Encounter for immunization: Secondary | ICD-10-CM | POA: Diagnosis not present

## 2015-02-16 MED ORDER — MEGESTROL ACETATE 40 MG PO TABS: 40.0000 mg | ORAL_TABLET | Freq: Two times a day (BID) | ORAL | Status: AC

## 2015-02-16 MED ORDER — MEDROXYPROGESTERONE ACETATE 10 MG PO TABS: 10.0000 mg | ORAL_TABLET | Freq: Every day | ORAL | Status: DC

## 2015-02-16 NOTE — Patient Instructions (Addendum)
Preventive Care for Adults A healthy lifestyle and preventive care can promote health and wellness. Preventive health guidelines for women include the following key practices.  A routine yearly physical is a good way to check with your health care provider about your health and preventive screening. It is a chance to share any concerns and updates on your health and to receive a thorough exam.  Visit your dentist for a routine exam and preventive care every 6 months. Brush your teeth twice a day and floss once a day. Good oral hygiene prevents tooth decay and gum disease.  The frequency of eye exams is based on your age, health, family medical history, use of contact lenses, and other factors. Follow your health care provider's recommendations for frequency of eye exams.  Eat a healthy diet. Foods like vegetables, fruits, whole grains, low-fat dairy products, and lean protein foods contain the nutrients you need without too many calories. Decrease your intake of foods high in solid fats, added sugars, and salt. Eat the right amount of calories for you.Get information about a proper diet from your health care provider, if necessary.  Regular physical exercise is one of the most important things you can do for your health. Most adults should get at least 150 minutes of moderate-intensity exercise (any activity that increases your heart rate and causes you to sweat) each week. In addition, most adults need muscle-strengthening exercises on 2 or more days a week.  Maintain a healthy weight. The body mass index (BMI) is a screening tool to identify possible weight problems. It provides an estimate of body fat based on height and weight. Your health care provider can find your BMI and can help you achieve or maintain a healthy weight.For adults 20 years and older:  A BMI below 18.5 is considered underweight.  A BMI of 18.5 to 24.9 is normal.  A BMI of 25 to 29.9 is considered overweight.  A BMI of  30 and above is considered obese.  Maintain normal blood lipids and cholesterol levels by exercising and minimizing your intake of saturated fat. Eat a balanced diet with plenty of fruit and vegetables. Blood tests for lipids and cholesterol should begin at age 20 and be repeated every 5 years. If your lipid or cholesterol levels are high, you are over 50, or you are at high risk for heart disease, you may need your cholesterol levels checked more frequently.Ongoing high lipid and cholesterol levels should be treated with medicines if diet and exercise are not working.  If you smoke, find out from your health care provider how to quit. If you do not use tobacco, do not start.  Lung cancer screening is recommended for adults aged 55-80 years who are at high risk for developing lung cancer because of a history of smoking. A yearly low-dose CT scan of the lungs is recommended for people who have at least a 30-pack-year history of smoking and are a current smoker or have quit within the past 15 years. A pack year of smoking is smoking an average of 1 pack of cigarettes a day for 1 year (for example: 1 pack a day for 30 years or 2 packs a day for 15 years). Yearly screening should continue until the smoker has stopped smoking for at least 15 years. Yearly screening should be stopped for people who develop a health problem that would prevent them from having lung cancer treatment.  If you are pregnant, do not drink alcohol. If you are breastfeeding,   be very cautious about drinking alcohol. If you are not pregnant and choose to drink alcohol, do not have more than 1 drink per day. One drink is considered to be 12 ounces (355 mL) of beer, 5 ounces (148 mL) of wine, or 1.5 ounces (44 mL) of liquor.  Avoid use of street drugs. Do not share needles with anyone. Ask for help if you need support or instructions about stopping the use of drugs.  High blood pressure causes heart disease and increases the risk of  stroke. Your blood pressure should be checked at least every 1 to 2 years. Ongoing high blood pressure should be treated with medicines if weight loss and exercise do not work.  If you are 75-52 years old, ask your health care provider if you should take aspirin to prevent strokes.  Diabetes screening involves taking a blood sample to check your fasting blood sugar level. This should be done once every 3 years, after age 15, if you are within normal weight and without risk factors for diabetes. Testing should be considered at a younger age or be carried out more frequently if you are overweight and have at least 1 risk factor for diabetes.  Breast cancer screening is essential preventive care for women. You should practice "breast self-awareness." This means understanding the normal appearance and feel of your breasts and may include breast self-examination. Any changes detected, no matter how small, should be reported to a health care provider. Women in their 58s and 30s should have a clinical breast exam (CBE) by a health care provider as part of a regular health exam every 1 to 3 years. After age 16, women should have a CBE every year. Starting at age 53, women should consider having a mammogram (breast X-ray test) every year. Women who have a family history of breast cancer should talk to their health care provider about genetic screening. Women at a high risk of breast cancer should talk to their health care providers about having an MRI and a mammogram every year.  Breast cancer gene (BRCA)-related cancer risk assessment is recommended for women who have family members with BRCA-related cancers. BRCA-related cancers include breast, ovarian, tubal, and peritoneal cancers. Having family members with these cancers may be associated with an increased risk for harmful changes (mutations) in the breast cancer genes BRCA1 and BRCA2. Results of the assessment will determine the need for genetic counseling and  BRCA1 and BRCA2 testing.  Routine pelvic exams to screen for cancer are no longer recommended for nonpregnant women who are considered low risk for cancer of the pelvic organs (ovaries, uterus, and vagina) and who do not have symptoms. Ask your health care provider if a screening pelvic exam is right for you.  If you have had past treatment for cervical cancer or a condition that could lead to cancer, you need Pap tests and screening for cancer for at least 20 years after your treatment. If Pap tests have been discontinued, your risk factors (such as having a new sexual partner) need to be reassessed to determine if screening should be resumed. Some women have medical problems that increase the chance of getting cervical cancer. In these cases, your health care provider may recommend more frequent screening and Pap tests.  The HPV test is an additional test that may be used for cervical cancer screening. The HPV test looks for the virus that can cause the cell changes on the cervix. The cells collected during the Pap test can be  tested for HPV. The HPV test could be used to screen women aged 30 years and older, and should be used in women of any age who have unclear Pap test results. After the age of 30, women should have HPV testing at the same frequency as a Pap test.  Colorectal cancer can be detected and often prevented. Most routine colorectal cancer screening begins at the age of 50 years and continues through age 75 years. However, your health care provider may recommend screening at an earlier age if you have risk factors for colon cancer. On a yearly basis, your health care provider may provide home test kits to check for hidden blood in the stool. Use of a small camera at the end of a tube, to directly examine the colon (sigmoidoscopy or colonoscopy), can detect the earliest forms of colorectal cancer. Talk to your health care provider about this at age 50, when routine screening begins. Direct  exam of the colon should be repeated every 5-10 years through age 75 years, unless early forms of pre-cancerous polyps or small growths are found.  People who are at an increased risk for hepatitis B should be screened for this virus. You are considered at high risk for hepatitis B if:  You were born in a country where hepatitis B occurs often. Talk with your health care provider about which countries are considered high risk.  Your parents were born in a high-risk country and you have not received a shot to protect against hepatitis B (hepatitis B vaccine).  You have HIV or AIDS.  You use needles to inject street drugs.  You live with, or have sex with, someone who has hepatitis B.  You get hemodialysis treatment.  You take certain medicines for conditions like cancer, organ transplantation, and autoimmune conditions.  Hepatitis C blood testing is recommended for all people born from 1945 through 1965 and any individual with known risks for hepatitis C.  Practice safe sex. Use condoms and avoid high-risk sexual practices to reduce the spread of sexually transmitted infections (STIs). STIs include gonorrhea, chlamydia, syphilis, trichomonas, herpes, HPV, and human immunodeficiency virus (HIV). Herpes, HIV, and HPV are viral illnesses that have no cure. They can result in disability, cancer, and death.  You should be screened for sexually transmitted illnesses (STIs) including gonorrhea and chlamydia if:  You are sexually active and are younger than 24 years.  You are older than 24 years and your health care provider tells you that you are at risk for this type of infection.  Your sexual activity has changed since you were last screened and you are at an increased risk for chlamydia or gonorrhea. Ask your health care provider if you are at risk.  If you are at risk of being infected with HIV, it is recommended that you take a prescription medicine daily to prevent HIV infection. This is  called preexposure prophylaxis (PrEP). You are considered at risk if:  You are a heterosexual woman, are sexually active, and are at increased risk for HIV infection.  You take drugs by injection.  You are sexually active with a partner who has HIV.  Talk with your health care provider about whether you are at high risk of being infected with HIV. If you choose to begin PrEP, you should first be tested for HIV. You should then be tested every 3 months for as long as you are taking PrEP.  Osteoporosis is a disease in which the bones lose minerals and strength   with aging. This can result in serious bone fractures or breaks. The risk of osteoporosis can be identified using a bone density scan. Women ages 65 years and over and women at risk for fractures or osteoporosis should discuss screening with their health care providers. Ask your health care provider whether you should take a calcium supplement or vitamin D to reduce the rate of osteoporosis.  Menopause can be associated with physical symptoms and risks. Hormone replacement therapy is available to decrease symptoms and risks. You should talk to your health care provider about whether hormone replacement therapy is right for you.  Use sunscreen. Apply sunscreen liberally and repeatedly throughout the day. You should seek shade when your shadow is shorter than you. Protect yourself by wearing long sleeves, pants, a wide-brimmed hat, and sunglasses year round, whenever you are outdoors.  Once a month, do a whole body skin exam, using a mirror to look at the skin on your back. Tell your health care provider of new moles, moles that have irregular borders, moles that are larger than a pencil eraser, or moles that have changed in shape or color.  Stay current with required vaccines (immunizations).  Influenza vaccine. All adults should be immunized every year.  Tetanus, diphtheria, and acellular pertussis (Td, Tdap) vaccine. Pregnant women should  receive 1 dose of Tdap vaccine during each pregnancy. The dose should be obtained regardless of the length of time since the last dose. Immunization is preferred during the 27th-36th week of gestation. An adult who has not previously received Tdap or who does not know her vaccine status should receive 1 dose of Tdap. This initial dose should be followed by tetanus and diphtheria toxoids (Td) booster doses every 10 years. Adults with an unknown or incomplete history of completing a 3-dose immunization series with Td-containing vaccines should begin or complete a primary immunization series including a Tdap dose. Adults should receive a Td booster every 10 years.  Varicella vaccine. An adult without evidence of immunity to varicella should receive 2 doses or a second dose if she has previously received 1 dose. Pregnant females who do not have evidence of immunity should receive the first dose after pregnancy. This first dose should be obtained before leaving the health care facility. The second dose should be obtained 4-8 weeks after the first dose.  Human papillomavirus (HPV) vaccine. Females aged 13-26 years who have not received the vaccine previously should obtain the 3-dose series. The vaccine is not recommended for use in pregnant females. However, pregnancy testing is not needed before receiving a dose. If a female is found to be pregnant after receiving a dose, no treatment is needed. In that case, the remaining doses should be delayed until after the pregnancy. Immunization is recommended for any person with an immunocompromised condition through the age of 26 years if she did not get any or all doses earlier. During the 3-dose series, the second dose should be obtained 4-8 weeks after the first dose. The third dose should be obtained 24 weeks after the first dose and 16 weeks after the second dose.  Zoster vaccine. One dose is recommended for adults aged 60 years or older unless certain conditions are  present.  Measles, mumps, and rubella (MMR) vaccine. Adults born before 1957 generally are considered immune to measles and mumps. Adults born in 1957 or later should have 1 or more doses of MMR vaccine unless there is a contraindication to the vaccine or there is laboratory evidence of immunity to   each of the three diseases. A routine second dose of MMR vaccine should be obtained at least 28 days after the first dose for students attending postsecondary schools, health care workers, or international travelers. People who received inactivated measles vaccine or an unknown type of measles vaccine during 1963-1967 should receive 2 doses of MMR vaccine. People who received inactivated mumps vaccine or an unknown type of mumps vaccine before 1979 and are at high risk for mumps infection should consider immunization with 2 doses of MMR vaccine. For females of childbearing age, rubella immunity should be determined. If there is no evidence of immunity, females who are not pregnant should be vaccinated. If there is no evidence of immunity, females who are pregnant should delay immunization until after pregnancy. Unvaccinated health care workers born before 1957 who lack laboratory evidence of measles, mumps, or rubella immunity or laboratory confirmation of disease should consider measles and mumps immunization with 2 doses of MMR vaccine or rubella immunization with 1 dose of MMR vaccine.  Pneumococcal 13-valent conjugate (PCV13) vaccine. When indicated, a person who is uncertain of her immunization history and has no record of immunization should receive the PCV13 vaccine. An adult aged 19 years or older who has certain medical conditions and has not been previously immunized should receive 1 dose of PCV13 vaccine. This PCV13 should be followed with a dose of pneumococcal polysaccharide (PPSV23) vaccine. The PPSV23 vaccine dose should be obtained at least 8 weeks after the dose of PCV13 vaccine. An adult aged 19  years or older who has certain medical conditions and previously received 1 or more doses of PPSV23 vaccine should receive 1 dose of PCV13. The PCV13 vaccine dose should be obtained 1 or more years after the last PPSV23 vaccine dose.  Pneumococcal polysaccharide (PPSV23) vaccine. When PCV13 is also indicated, PCV13 should be obtained first. All adults aged 65 years and older should be immunized. An adult younger than age 65 years who has certain medical conditions should be immunized. Any person who resides in a nursing home or long-term care facility should be immunized. An adult smoker should be immunized. People with an immunocompromised condition and certain other conditions should receive both PCV13 and PPSV23 vaccines. People with human immunodeficiency virus (HIV) infection should be immunized as soon as possible after diagnosis. Immunization during chemotherapy or radiation therapy should be avoided. Routine use of PPSV23 vaccine is not recommended for American Indians, [place] Natives, or people younger than 65 years unless there are medical conditions that require PPSV23 vaccine. When indicated, people who have unknown immunization and have no record of immunization should receive PPSV23 vaccine. One-time revaccination 5 years after the first dose of PPSV23 is recommended for people aged 19-64 years who have chronic kidney failure, nephrotic syndrome, asplenia, or immunocompromised conditions. People who received 1-2 doses of PPSV23 before age 65 years should receive another dose of PPSV23 vaccine at age 65 years or later if at least 5 years have passed since the previous dose. Doses of PPSV23 are not needed for people immunized with PPSV23 at or after age 65 years.  Meningococcal vaccine. Adults with asplenia or persistent complement component deficiencies should receive 2 doses of quadrivalent meningococcal conjugate (MenACWY-D) vaccine. The doses should be obtained at least 2 months apart.  Microbiologists working with certain meningococcal bacteria, military recruits, people at risk during an outbreak, and people who travel to or live in countries with a high rate of meningitis should be immunized. A first-year college student up through age   21 years who is living in a residence hall should receive a dose if she did not receive a dose on or after her 16th birthday. Adults who have certain high-risk conditions should receive one or more doses of vaccine.  Hepatitis A vaccine. Adults who wish to be protected from this disease, have certain high-risk conditions, work with hepatitis A-infected animals, work in hepatitis A research labs, or travel to or work in countries with a high rate of hepatitis A should be immunized. Adults who were previously unvaccinated and who anticipate close contact with an international adoptee during the first 60 days after arrival in the [place] from a country with a high rate of hepatitis A should be immunized.  Hepatitis B vaccine. Adults who wish to be protected from this disease, have certain high-risk conditions, may be exposed to blood or other infectious body fluids, are household contacts or sex partners of hepatitis B positive people, are clients or workers in certain care facilities, or travel to or work in countries with a high rate of hepatitis B should be immunized.  Haemophilus influenzae type b (Hib) vaccine. A previously unvaccinated person with asplenia or sickle cell disease or having a scheduled splenectomy should receive 1 dose of Hib vaccine. Regardless of previous immunization, a recipient of a hematopoietic stem cell transplant should receive a 3-dose series 6-12 months after her successful transplant. Hib vaccine is not recommended for adults with HIV infection. Preventive Services / Frequency Ages 64 to 68 years  Blood pressure check.** / Every 1 to 2 years.  Lipid and cholesterol check.** / Every 5 years beginning at age  22.  Clinical breast exam.** / Every 3 years for women in their 88s and 53s.  BRCA-related cancer risk assessment.** / For women who have family members with a BRCA-related cancer (breast, ovarian, tubal, or peritoneal cancers).  Pap test.** / Every 2 years from ages 90 through 51. Every 3 years starting at age 21 through age 56 or 3 with a history of 3 consecutive normal Pap tests.  HPV screening.** / Every 3 years from ages 24 through ages 1 to 46 with a history of 3 consecutive normal Pap tests.  Hepatitis C blood test.** / For any individual with known risks for hepatitis C.  Skin self-exam. / Monthly.  Influenza vaccine. / Every year.  Tetanus, diphtheria, and acellular pertussis (Tdap, Td) vaccine.** / Consult your health care provider. Pregnant women should receive 1 dose of Tdap vaccine during each pregnancy. 1 dose of Td every 10 years.  Varicella vaccine.** / Consult your health care provider. Pregnant females who do not have evidence of immunity should receive the first dose after pregnancy.  HPV vaccine. / 3 doses over 6 months, if 72 and younger. The vaccine is not recommended for use in pregnant females. However, pregnancy testing is not needed before receiving a dose.  Measles, mumps, rubella (MMR) vaccine.** / You need at least 1 dose of MMR if you were born in 1957 or later. You may also need a 2nd dose. For females of childbearing age, rubella immunity should be determined. If there is no evidence of immunity, females who are not pregnant should be vaccinated. If there is no evidence of immunity, females who are pregnant should delay immunization until after pregnancy.  Pneumococcal 13-valent conjugate (PCV13) vaccine.** / Consult your health care provider.  Pneumococcal polysaccharide (PPSV23) vaccine.** / 1 to 2 doses if you smoke cigarettes or if you have certain conditions.  Meningococcal vaccine.** /  1 dose if you are age 19 to 21 years and a first-year college  student living in a residence hall, or have one of several medical conditions, you need to get vaccinated against meningococcal disease. You may also need additional booster doses.  Hepatitis A vaccine.** / Consult your health care provider.  Hepatitis B vaccine.** / Consult your health care provider.  Haemophilus influenzae type b (Hib) vaccine.** / Consult your health care provider. Ages 40 to 64 years  Blood pressure check.** / Every 1 to 2 years.  Lipid and cholesterol check.** / Every 5 years beginning at age 20 years.  Lung cancer screening. / Every year if you are aged 55-80 years and have a 30-pack-year history of smoking and currently smoke or have quit within the past 15 years. Yearly screening is stopped once you have quit smoking for at least 15 years or develop a health problem that would prevent you from having lung cancer treatment.  Clinical breast exam.** / Every year after age 40 years.  BRCA-related cancer risk assessment.** / For women who have family members with a BRCA-related cancer (breast, ovarian, tubal, or peritoneal cancers).  Mammogram.** / Every year beginning at age 40 years and continuing for as long as you are in good health. Consult with your health care provider.  Pap test.** / Every 3 years starting at age 30 years through age 65 or 70 years with a history of 3 consecutive normal Pap tests.  HPV screening.** / Every 3 years from ages 30 years through ages 65 to 70 years with a history of 3 consecutive normal Pap tests.  Fecal occult blood test (FOBT) of stool. / Every year beginning at age 50 years and continuing until age 75 years. You may not need to do this test if you get a colonoscopy every 10 years.  Flexible sigmoidoscopy or colonoscopy.** / Every 5 years for a flexible sigmoidoscopy or every 10 years for a colonoscopy beginning at age 50 years and continuing until age 75 years.  Hepatitis C blood test.** / For all people born from 1945 through  1965 and any individual with known risks for hepatitis C.  Skin self-exam. / Monthly.  Influenza vaccine. / Every year.  Tetanus, diphtheria, and acellular pertussis (Tdap/Td) vaccine.** / Consult your health care provider. Pregnant women should receive 1 dose of Tdap vaccine during each pregnancy. 1 dose of Td every 10 years.  Varicella vaccine.** / Consult your health care provider. Pregnant females who do not have evidence of immunity should receive the first dose after pregnancy.  Zoster vaccine.** / 1 dose for adults aged 60 years or older.  Measles, mumps, rubella (MMR) vaccine.** / You need at least 1 dose of MMR if you were born in 1957 or later. You may also need a 2nd dose. For females of childbearing age, rubella immunity should be determined. If there is no evidence of immunity, females who are not pregnant should be vaccinated. If there is no evidence of immunity, females who are pregnant should delay immunization until after pregnancy.  Pneumococcal 13-valent conjugate (PCV13) vaccine.** / Consult your health care provider.  Pneumococcal polysaccharide (PPSV23) vaccine.** / 1 to 2 doses if you smoke cigarettes or if you have certain conditions.  Meningococcal vaccine.** / Consult your health care provider.  Hepatitis A vaccine.** / Consult your health care provider.  Hepatitis B vaccine.** / Consult your health care provider.  Haemophilus influenzae type b (Hib) vaccine.** / Consult your health care provider. Ages 65   years and over  Blood pressure check.** / Every 1 to 2 years.  Lipid and cholesterol check.** / Every 5 years beginning at age 22 years.  Lung cancer screening. / Every year if you are aged 73-80 years and have a 30-pack-year history of smoking and currently smoke or have quit within the past 15 years. Yearly screening is stopped once you have quit smoking for at least 15 years or develop a health problem that would prevent you from having lung cancer  treatment.  Clinical breast exam.** / Every year after age 4 years.  BRCA-related cancer risk assessment.** / For women who have family members with a BRCA-related cancer (breast, ovarian, tubal, or peritoneal cancers).  Mammogram.** / Every year beginning at age 40 years and continuing for as long as you are in good health. Consult with your health care provider.  Pap test.** / Every 3 years starting at age 9 years through age 34 or 91 years with 3 consecutive normal Pap tests. Testing can be stopped between 65 and 70 years with 3 consecutive normal Pap tests and no abnormal Pap or HPV tests in the past 10 years.  HPV screening.** / Every 3 years from ages 57 years through ages 64 or 45 years with a history of 3 consecutive normal Pap tests. Testing can be stopped between 65 and 70 years with 3 consecutive normal Pap tests and no abnormal Pap or HPV tests in the past 10 years.  Fecal occult blood test (FOBT) of stool. / Every year beginning at age 15 years and continuing until age 17 years. You may not need to do this test if you get a colonoscopy every 10 years.  Flexible sigmoidoscopy or colonoscopy.** / Every 5 years for a flexible sigmoidoscopy or every 10 years for a colonoscopy beginning at age 86 years and continuing until age 71 years.  Hepatitis C blood test.** / For all people born from [birth year] through 1966 and any individual with known risks for hepatitis C.  Osteoporosis screening.** / A one-time screening for women ages 83 years and over and women at risk for fractures or osteoporosis.  Skin self-exam. / Monthly.  Influenza vaccine. / Every year.  Tetanus, diphtheria, and acellular pertussis (Tdap/Td) vaccine.** / 1 dose of Td every 10 years.  Varicella vaccine.** / Consult your health care provider.  Zoster vaccine.** / 1 dose for adults aged 61 years or older.  Pneumococcal 13-valent conjugate (PCV13) vaccine.** / Consult your health care provider.  Pneumococcal  polysaccharide (PPSV23) vaccine.** / 1 dose for all adults aged 28 years and older.  Meningococcal vaccine.** / Consult your health care provider.  Hepatitis A vaccine.** / Consult your health care provider.  Hepatitis B vaccine.** / Consult your health care provider.  Haemophilus influenzae type b (Hib) vaccine.** / Consult your health care provider. ** Family history and personal history of risk and conditions may change your health care provider's recommendations. Document Released: 06/27/2001 Document Revised: 09/15/2013 Document Reviewed: 09/26/2010 [facility] Patient Information 2015 [place], [place]. This information is not intended to replace advice given to you by your health care provider. Make sure you discuss any questions you have with your health care provider.

## 2015-02-16 NOTE — Progress Notes (Signed)
  Subjective:     Rhonda Rodgers is a 34 y.o. female and is here for a comprehensive physical exam. The patient reports problems - no cycle x 1 month with spotting in August. Takes megestrol if bleeding longer than 7 days. has previously had a problem with skipping cycles.  Social History   Social History  . Marital Status: Married    Spouse Name: N/A  . Number of Children: N/A  . Years of Education: N/A   Occupational History  . Not on file.   Social History Main Topics  . Smoking status: Never Smoker   . Smokeless tobacco: Never Used  . Alcohol Use: No  . Drug Use: No  . Sexual Activity:    Partners: Male    Birth Control/ Protection: Surgical     Comment: tubaligation   Other Topics Concern  . Not on file   Social History Narrative   Health Maintenance  Topic Date Due  . Janet Berlin  08/30/1998  . INFLUENZA VACCINE  12/14/2014  . PAP SMEAR  12/10/2016  . HIV Screening  Completed    The following portions of the patient's history were reviewed and updated as appropriate: allergies, current medications, past family history, past medical history, past social history, past surgical history and problem list.  Review of Systems Pertinent items noted in HPI and remainder of comprehensive ROS otherwise negative.   Objective:    BP 118/80 mmHg  Pulse 79  Ht  (1.753 m)  Wt 281 lb (127.461 kg)  BMI 41.48 kg/m2  LMP 01/02/2015 General appearance: alert, cooperative, appears stated age and moderately obese Head: Normocephalic, without obvious abnormality, atraumatic Neck: no adenopathy, supple, symmetrical, trachea midline and thyroid not enlarged, symmetric, no tenderness/mass/nodules Lungs: clear to auscultation bilaterally Breasts: normal appearance, no masses or tenderness Heart: regular rate and rhythm, S1, S2 normal, no murmur, click, rub or gallop Abdomen: soft, non-tender; bowel sounds normal; no masses,  no organomegaly Pelvic: cervix normal in  appearance, external genitalia normal, no adnexal masses or tenderness, no cervical motion tenderness, uterus normal size, shape, and consistency and vagina normal without discharge Extremities: extremities normal, atraumatic, no cyanosis or edema Pulses: 2+ and symmetric Skin: Skin color, texture, turgor normal. No rashes or lesions Lymph nodes: Cervical, supraclavicular, and axillary nodes normal. Neurologic: Grossly normal    Assessment:    Healthy female exam.      Plan:   Problem List Items Addressed This Visit      Unprioritized   Menorrhagia - Primary   Relevant Medications   medroxyPROGESTERone (PROVERA) 10 MG tablet   megestrol (MEGACE) 40 MG tablet    Other Visit Diagnoses    Screening for cervical cancer        Relevant Orders    Cytology - PAP    Influenza vaccination administered at current visit        Relevant Orders    Flu Vaccine QUAD 36+ mos IM (Fluarix, Quad PF) (Completed)    Amenorrhea        Relevant Orders    POCT urine pregnancy         See After Visit Summary for Counseling Recommendations

## 2015-02-16 NOTE — Progress Notes (Signed)
Pt had light spotting in August, no period noted since then.

## 2015-02-18 LAB — CYTOLOGY - PAP

## 2015-03-16 ENCOUNTER — Encounter: Payer: Self-pay | Admitting: Family Medicine

## 2015-03-16 ENCOUNTER — Ambulatory Visit (INDEPENDENT_AMBULATORY_CARE_PROVIDER_SITE_OTHER): Payer: BLUE CROSS/BLUE SHIELD | Admitting: Family Medicine

## 2015-03-16 DIAGNOSIS — N915 Oligomenorrhea, unspecified: Secondary | ICD-10-CM | POA: Diagnosis not present

## 2015-03-16 DIAGNOSIS — R319 Hematuria, unspecified: Secondary | ICD-10-CM

## 2015-03-16 LAB — POCT URINALYSIS DIPSTICK
Bilirubin, UA: NEGATIVE
Blood, UA: NEGATIVE
Glucose, UA: NEGATIVE
Ketones, UA: NEGATIVE
LEUKOCYTES UA: NEGATIVE
NITRITE UA: NEGATIVE
PH UA: 5
PROTEIN UA: NEGATIVE
Spec Grav, UA: 1.01
UROBILINOGEN UA: 0.2

## 2015-03-16 MED ORDER — MEDROXYPROGESTERONE ACETATE 10 MG PO TABS: 10.0000 mg | ORAL_TABLET | Freq: Two times a day (BID) | ORAL | Status: AC

## 2015-03-16 NOTE — Progress Notes (Signed)
    Subjective:    Patient ID: Ernst BowlerChristine A Maguire is a 34 y.o. female presenting with Amenorrhea  on 03/16/2015  HPI: No cycle since August. Spotted with 20 days of Provera, but no cycle.  Had normal TSH with PE 1 wk ago. Is under a lot of stress. Would like U/A as had blood in it last week.  Review of Systems  Constitutional: Negative for fever and chills.  Respiratory: Negative for shortness of breath.   Cardiovascular: Negative for chest pain.  Gastrointestinal: Negative for nausea, vomiting and abdominal pain.  Genitourinary: Negative for dysuria.  Skin: Negative for rash.      Objective:    BP 134/87 mmHg  Pulse 76  Wt 277 lb (125.646 kg)  LMP 01/02/2015 Physical Exam  Constitutional: She is oriented to person, place, and time. She appears well-developed and well-nourished. No distress.  HENT:  Head: Normocephalic and atraumatic.  Eyes: No scleral icterus.  Neck: Neck supple.  Cardiovascular: Normal rate.   Pulmonary/Chest: Effort normal.  Abdominal: Soft.  Neurological: She is alert and oriented to person, place, and time.  Skin: Skin is warm and dry.  Psychiatric: She has a normal mood and affect.   9:10 AM        Color, UA Yellow    Clarity, UA Cloudy    Glucose, UA Negative    Bilirubin, UA Negative    Ketones, UA Negative    Spec Grav, UA 1.010    Blood, UA Negative    pH, UA 5.0    Protein, UA Negative    Urobilinogen, UA 0.2    Nitrite, UA Negative    Leukocytes, UA Negative             Assessment & Plan:   Problem List Items Addressed This Visit      Unprioritized   Oligomenorrhea    Check FSH, and PRL and serum HCG.  Patient should respond to provera.  If does not cycle this time, may need estrogen?      Relevant Medications   medroxyPROGESTERone (PROVERA) 10 MG tablet   Other Relevant Orders   Prolactin   hCG, quantitative, pregnancy   Follicle stimulating hormone    Other Visit Diagnoses    Hematuria        Relevant Orders    POCT Urinalysis Dipstick (Completed)       Jenipher Havel S 03/16/2015 8:46 AM

## 2015-03-16 NOTE — Assessment & Plan Note (Signed)
Check FSH, and PRL and serum HCG.  Patient should respond to provera.  If does not cycle this time, may need estrogen?

## 2015-03-16 NOTE — Patient Instructions (Signed)
Secondary Amenorrhea Secondary amenorrhea is the stopping of menstrual flow for 3-6 months in a female who has previously had periods. There are many possible causes. Most of these causes are not serious. Usually, treating the underlying problem causing the loss of menses will return your periods to normal. CAUSES  Some common and uncommon causes of not menstruating include:  Malnutrition.  Low blood sugar (hypoglycemia).  Polycystic ovary disease.  Stress or fear.  Breastfeeding.  Hormone imbalance.  Ovarian failure.  Medicines.  Extreme obesity.  Cystic fibrosis.  Low body weight or drastic weight reduction from any cause.  Early menopause.  Removal of ovaries or uterus.  Contraceptives.  Illness.  Long-term (chronic) illnesses.  Cushing syndrome.  Thyroid problems.  Birth control pills, patches, or vaginal rings for birth control. RISK FACTORS You may be at greater risk of secondary amenorrhea if:  You have a family history of this condition.  You have an eating disorder.  You do athletic training. DIAGNOSIS  A diagnosis is made by your health care provider taking a medical history and doing a physical exam. This will include a pelvic exam to check for problems with your reproductive organs. Pregnancy must be ruled out. Often, numerous blood tests are done to measure different hormones in the body. Urine testing may be done. Specialized exams (ultrasound, CT scan, MRI, or hysteroscopy) may have to be done as well as measuring the body mass index (BMI). TREATMENT  Treatment depends on the cause of the amenorrhea. If an eating disorder is present, this can be treated with an adequate diet and therapy. Chronic illnesses may improve with treatment of the illness. Amenorrhea may be corrected with medicines, lifestyle changes, or surgery. If the amenorrhea cannot be corrected, it is sometimes possible to create a false menstruation with medicines. HOME CARE  INSTRUCTIONS  Maintain a healthy diet.  Manage weight problems.  Exercise regularly but not excessively.  Get adequate sleep.  Manage stress.  Be aware of changes in your menstrual cycle. Keep a record of when your periods occur. Note the date your period starts, how long it lasts, and any problems. SEEK MEDICAL CARE IF: Your symptoms do not get better with treatment.   This information is not intended to replace advice given to you by your health care provider. Make sure you discuss any questions you have with your health care provider.   Document Released: 06/12/2006 Document Revised: 05/22/2014 Document Reviewed: 10/17/2012 Elsevier Interactive Patient Education 2016 [employer]  

## 2015-03-17 LAB — HCG, QUANTITATIVE, PREGNANCY

## 2015-03-17 LAB — PROLACTIN: Prolactin: 7.5 ng/mL

## 2015-03-17 LAB — FOLLICLE STIMULATING HORMONE: FSH: 3.9 m[IU]/mL

## 2015-04-26 ENCOUNTER — Telehealth: Payer: Self-pay | Admitting: *Deleted

## 2015-04-26 NOTE — Telephone Encounter (Signed)
Pt called, was seen on 03-16-15 for amenorrhea, given Provera 10mg  twice daily, pt stated she took it for two weeks and had no bleeding, stopped for one week and started spotting for 5 days.  Wants to know if that would be considered a menstrual cycle and should she start birth control.  Spoke to Dr Shawnie PonsPratt, recommended pt to come in for follow-up and to schedule possible US to check endometrial stripe.  Scheduled appt for 04-30-15 to follow-up.  Pt acknowledged.

## 2015-04-30 ENCOUNTER — Ambulatory Visit (INDEPENDENT_AMBULATORY_CARE_PROVIDER_SITE_OTHER): Payer: BLUE CROSS/BLUE SHIELD | Admitting: Family Medicine

## 2015-04-30 VITALS — BP 125/89 | HR 73 | Resp 20 | Ht 69.0 in

## 2015-04-30 DIAGNOSIS — N920 Excessive and frequent menstruation with regular cycle: Secondary | ICD-10-CM | POA: Diagnosis not present

## 2015-04-30 DIAGNOSIS — N915 Oligomenorrhea, unspecified: Secondary | ICD-10-CM

## 2015-04-30 NOTE — Progress Notes (Signed)
    Subjective:    Patient ID: Rhonda Rodgers is a 34 y.o. female presenting with No chief complaint on file.  on 04/30/2015  HPI: Has had amenorrhea. Spotted at Thanksgiving. Has been on Megace and Provera.  Review of Systems  Constitutional: Negative for fever and chills.  Respiratory: Negative for shortness of breath.   Cardiovascular: Negative for chest pain.  Gastrointestinal: Negative for nausea, vomiting and abdominal pain.  Genitourinary: Negative for dysuria.  Skin: Negative for rash.      Objective:    BP 125/89 mmHg  Pulse 73  Resp 20  Ht 5\' 9"  (1.753 m) Physical Exam  Constitutional: She is oriented to person, place, and time. She appears well-developed and well-nourished. No distress.  HENT:  Head: Normocephalic and atraumatic.  Eyes: No scleral icterus.  Neck: Neck supple.  Cardiovascular: Normal rate.   Pulmonary/Chest: Effort normal.  Abdominal: Soft.  Neurological: She is alert and oriented to person, place, and time.  Skin: Skin is warm and dry.  Psychiatric: She has a normal mood and affect.        Assessment & Plan:   Problem List Items Addressed This Visit      Unprioritized   Oligomenorrhea    ? Is this related ot hormones--check pelvic sono--if thickened, consider EMB or D & C.  If thin, await next cycle--if heavy consider OC's.      Relevant Orders   US Pelvis Complete   US Transvaginal Non-OB   Menorrhagia - Primary    Stop all hormones and let her body reset        Return in about 4 weeks (around 05/28/2015).   PRATT,TANYA S 04/30/2015 9:28 AM

## 2015-04-30 NOTE — Patient Instructions (Signed)
Secondary Amenorrhea Secondary amenorrhea is the stopping of menstrual flow for 3-6 months in a female who has previously had periods. There are many possible causes. Most of these causes are not serious. Usually, treating the underlying problem causing the loss of menses will return your periods to normal. CAUSES  Some common and uncommon causes of not menstruating include:  Malnutrition.  Low blood sugar (hypoglycemia).  Polycystic ovary disease.  Stress or fear.  Breastfeeding.  Hormone imbalance.  Ovarian failure.  Medicines.  Extreme obesity.  Cystic fibrosis.  Low body weight or drastic weight reduction from any cause.  Early menopause.  Removal of ovaries or uterus.  Contraceptives.  Illness.  Long-term (chronic) illnesses.  Cushing syndrome.  Thyroid problems.  Birth control pills, patches, or vaginal rings for birth control. RISK FACTORS You may be at greater risk of secondary amenorrhea if:  You have a family history of this condition.  You have an eating disorder.  You do athletic training. DIAGNOSIS  A diagnosis is made by your health care provider taking a medical history and doing a physical exam. This will include a pelvic exam to check for problems with your reproductive organs. Pregnancy must be ruled out. Often, numerous blood tests are done to measure different hormones in the body. Urine testing may be done. Specialized exams (ultrasound, CT scan, MRI, or hysteroscopy) may have to be done as well as measuring the body mass index (BMI). TREATMENT  Treatment depends on the cause of the amenorrhea. If an eating disorder is present, this can be treated with an adequate diet and therapy. Chronic illnesses may improve with treatment of the illness. Amenorrhea may be corrected with medicines, lifestyle changes, or surgery. If the amenorrhea cannot be corrected, it is sometimes possible to create a false menstruation with medicines. HOME CARE  INSTRUCTIONS  Maintain a healthy diet.  Manage weight problems.  Exercise regularly but not excessively.  Get adequate sleep.  Manage stress.  Be aware of changes in your menstrual cycle. Keep a record of when your periods occur. Note the date your period starts, how long it lasts, and any problems. SEEK MEDICAL CARE IF: Your symptoms do not get better with treatment.   This information is not intended to replace advice given to you by your health care provider. Make sure you discuss any questions you have with your health care provider.   Document Released: 06/12/2006 Document Revised: 05/22/2014 Document Reviewed: 10/17/2012 Elsevier Interactive Patient Education 2016 [employer]  

## 2015-05-03 ENCOUNTER — Encounter: Payer: Self-pay | Admitting: Family Medicine

## 2015-05-03 ENCOUNTER — Ambulatory Visit: Payer: BLUE CROSS/BLUE SHIELD | Admitting: Family Medicine

## 2015-05-03 NOTE — Assessment & Plan Note (Signed)
Stop all hormones and let her body reset

## 2015-05-03 NOTE — Assessment & Plan Note (Signed)
?   Is this related ot hormones--check pelvic sono--if thickened, consider EMB or D & C.  If thin, await next cycle--if heavy consider OC's.

## 2015-05-04 ENCOUNTER — Ambulatory Visit
Admission: RE | Admit: 2015-05-04 | Discharge: 2015-05-04 | Disposition: A | Discharge status: Home / Self Care | Payer: BLUE CROSS/BLUE SHIELD | Source: Ambulatory Visit | Attending: Family Medicine | Admitting: Family Medicine

## 2015-05-04 DIAGNOSIS — N915 Oligomenorrhea, unspecified: Secondary | ICD-10-CM

## 2015-05-04 DIAGNOSIS — N83202 Unspecified ovarian cyst, left side: Secondary | ICD-10-CM | POA: Insufficient documentation

## 2015-05-06 ENCOUNTER — Telehealth: Payer: Self-pay | Admitting: *Deleted

## 2015-05-06 DIAGNOSIS — N760 Acute vaginitis: Principal | ICD-10-CM

## 2015-05-06 DIAGNOSIS — B9689 Other specified bacterial agents as the cause of diseases classified elsewhere: Secondary | ICD-10-CM

## 2015-05-06 MED ORDER — METRONIDAZOLE 500 MG PO TABS: 500.0000 mg | ORAL_TABLET | Freq: Two times a day (BID) | ORAL | Status: DC

## 2015-05-06 NOTE — Telephone Encounter (Signed)
Patient called and is having symptoms of bacterial vaginosis.  Patient is having a discharge with an odor.  I have sent in Flagyl to patients pharmacy.  Patient aware.

## 2015-05-24 ENCOUNTER — Ambulatory Visit: Payer: BLUE CROSS/BLUE SHIELD | Admitting: Family Medicine

## 2015-06-24 ENCOUNTER — Ambulatory Visit (INDEPENDENT_AMBULATORY_CARE_PROVIDER_SITE_OTHER): Payer: BLUE CROSS/BLUE SHIELD | Admitting: Family Medicine

## 2015-06-24 ENCOUNTER — Encounter: Payer: Self-pay | Admitting: Family Medicine

## 2015-06-24 VITALS — BP 135/90 | HR 82 | Wt 292.0 lb

## 2015-06-24 DIAGNOSIS — N915 Oligomenorrhea, unspecified: Secondary | ICD-10-CM | POA: Diagnosis not present

## 2015-06-24 DIAGNOSIS — Z113 Encounter for screening for infections with a predominantly sexual mode of transmission: Secondary | ICD-10-CM | POA: Diagnosis not present

## 2015-06-24 NOTE — Assessment & Plan Note (Signed)
Discussed implications. Will await to see if her normal cycle prevails. Under much less stress at this time.

## 2015-06-24 NOTE — Progress Notes (Signed)
    Subjective:    Patient ID: Rhonda Rodgers is a 35 y.o. female presenting with Follow-up and STD screening  on 06/24/2015  HPI: Previously with amenorrhea. Now on cycle.  U/s showed normal endometrial stripe. Bleeding is light, probably related to use of provera and megace in the past.  Review of Systems  Constitutional: Negative for fever and chills.  Respiratory: Negative for shortness of breath.   Cardiovascular: Negative for chest pain.  Gastrointestinal: Negative for nausea, vomiting and abdominal pain.  Genitourinary: Negative for dysuria.  Skin: Negative for rash.      Objective:    BP 135/90 mmHg  Pulse 82  Wt 292 lb (132.45 kg)  LMP 06/19/2015 Physical Exam  Constitutional: She is oriented to person, place, and time. She appears well-developed and well-nourished. No distress.  HENT:  Head: Normocephalic and atraumatic.  Eyes: No scleral icterus.  Neck: Neck supple.  Cardiovascular: Normal rate.   Pulmonary/Chest: Effort normal.  Abdominal: Soft.  Neurological: She is alert and oriented to person, place, and time.  Skin: Skin is warm and dry.  Psychiatric: She has a normal mood and affect.   Uterus  Measurements: 9.3 x 4.7 x 4.6 cm. No fibroids or other mass visualized. Small nabothian cysts.  Endometrium  Thickness: 10.4 mm. 3 mm echogenic focus noted along the upper portion of the endometrial canal.  Right ovary  Measurements: 2.4 x 2.1 x 2.1 cm. Ovary is difficult to visualize what appears to be the ovary appears normal.  Left ovary  Measurements: 3.7 x 2.8 x 3.7 cm. 3.2 x 2.2 x 3.0 cm simple cyst.  Other findings  No abnormal free fluid.     Assessment & Plan:   Problem List Items Addressed This Visit      Unprioritized   Oligomenorrhea    Discussed implications. Will await to see if her normal cycle prevails. Under much less stress at this time.       Other Visit Diagnoses    Screening for STD (sexually transmitted  disease)    -  Primary    Relevant Orders    Urine cytology ancillary only    HIV antibody (with reflex)    RPR    Hepatitis C Antibody    Hepatitis B Surface AntiGEN       Return in about 3 months (around 09/21/2015) for a follow-up.  Tina Gruner S 06/24/2015 10:25 AM

## 2015-06-24 NOTE — Patient Instructions (Signed)
Secondary Amenorrhea Secondary amenorrhea is the stopping of menstrual flow for 3-6 months in a female who has previously had periods. There are many possible causes. Most of these causes are not serious. Usually, treating the underlying problem causing the loss of menses will return your periods to normal. CAUSES  Some common and uncommon causes of not menstruating include:  Malnutrition.  Low blood sugar (hypoglycemia).  Polycystic ovary disease.  Stress or fear.  Breastfeeding.  Hormone imbalance.  Ovarian failure.  Medicines.  Extreme obesity.  Cystic fibrosis.  Low body weight or drastic weight reduction from any cause.  Early menopause.  Removal of ovaries or uterus.  Contraceptives.  Illness.  Long-term (chronic) illnesses.  Cushing syndrome.  Thyroid problems.  Birth control pills, patches, or vaginal rings for birth control. RISK FACTORS You may be at greater risk of secondary amenorrhea if:  You have a family history of this condition.  You have an eating disorder.  You do athletic training. DIAGNOSIS  A diagnosis is made by your health care provider taking a medical history and doing a physical exam. This will include a pelvic exam to check for problems with your reproductive organs. Pregnancy must be ruled out. Often, numerous blood tests are done to measure different hormones in the body. Urine testing may be done. Specialized exams (ultrasound, CT scan, MRI, or hysteroscopy) may have to be done as well as measuring the body mass index (BMI). TREATMENT  Treatment depends on the cause of the amenorrhea. If an eating disorder is present, this can be treated with an adequate diet and therapy. Chronic illnesses may improve with treatment of the illness. Amenorrhea may be corrected with medicines, lifestyle changes, or surgery. If the amenorrhea cannot be corrected, it is sometimes possible to create a false menstruation with medicines. HOME CARE  INSTRUCTIONS  Maintain a healthy diet.  Manage weight problems.  Exercise regularly but not excessively.  Get adequate sleep.  Manage stress.  Be aware of changes in your menstrual cycle. Keep a record of when your periods occur. Note the date your period starts, how long it lasts, and any problems. SEEK MEDICAL CARE IF: Your symptoms do not get better with treatment.   This information is not intended to replace advice given to you by your health care provider. Make sure you discuss any questions you have with your health care provider.   Document Released: 06/12/2006 Document Revised: 05/22/2014 Document Reviewed: 10/17/2012 Elsevier Interactive Patient Education 2016 [employer]  

## 2015-06-25 LAB — HIV ANTIBODY (ROUTINE TESTING W REFLEX): HIV: NONREACTIVE

## 2015-06-25 LAB — URINE CYTOLOGY ANCILLARY ONLY
Chlamydia: NEGATIVE
NEISSERIA GONORRHEA: NEGATIVE
Trichomonas: NEGATIVE

## 2015-06-25 LAB — RPR

## 2015-06-25 LAB — HEPATITIS C ANTIBODY: HCV AB: NEGATIVE

## 2015-06-25 LAB — HEPATITIS B SURFACE ANTIGEN: Hepatitis B Surface Ag: NEGATIVE

## 2015-07-08 ENCOUNTER — Ambulatory Visit: Payer: BLUE CROSS/BLUE SHIELD | Admitting: Family Medicine

## 2015-09-11 IMAGING — CT CT RENAL STONE PROTOCOL
1 of 2 series · 15 of 32 positions shown, 19 images · non-contrast
Comparison: CT of the abdomen and pelvis from 09/03/2012, and
pelvic ultrasound performed 06/10/2014

CLINICAL DATA: Acute onset of left flank pain. Urinary urgency and
increased urinary frequency. Initial encounter.

EXAM:
CT ABDOMEN AND PELVIS WITHOUT CONTRAST
TECHNIQUE: Multidetector CT imaging of the abdomen and pelvis was performed
following the standard protocol without IV contrast.

[Series 2: stone standard full · axial · 0.81mm/px · z∈[-514,-39]mm · 15 of 105 slices shown, 19 images]
[im 5/105  soft-tissue]
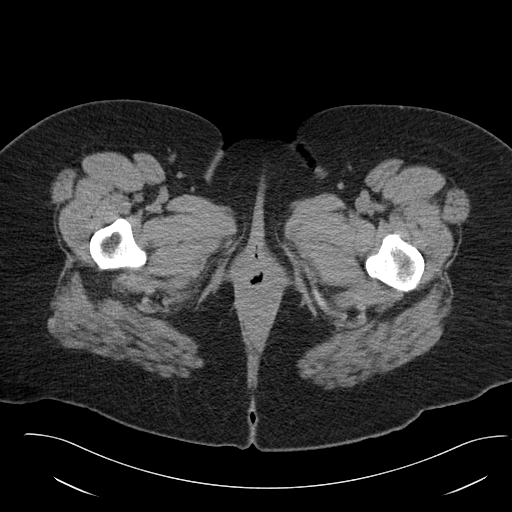
[im 5/105  bone]
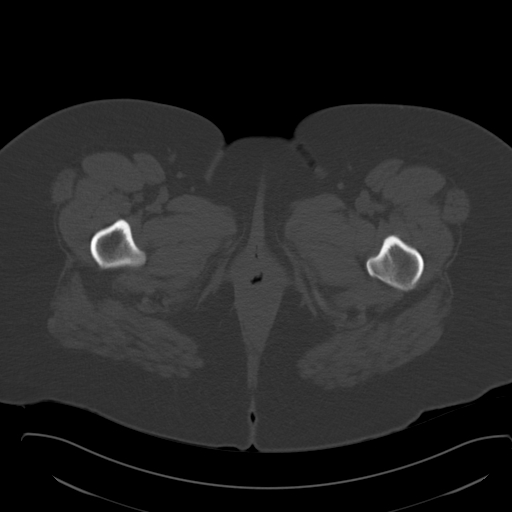
[im 13/105  soft-tissue]
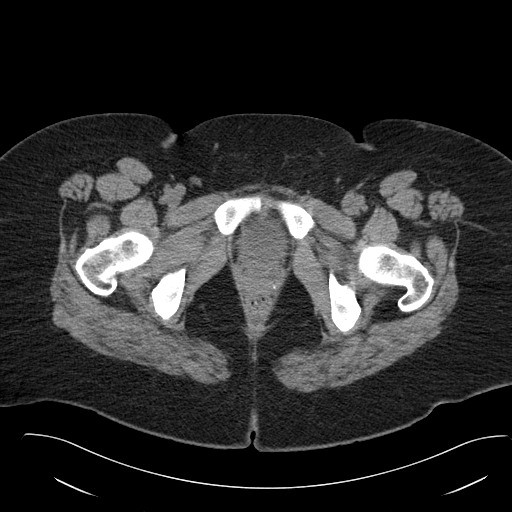
[im 21/105  soft-tissue]
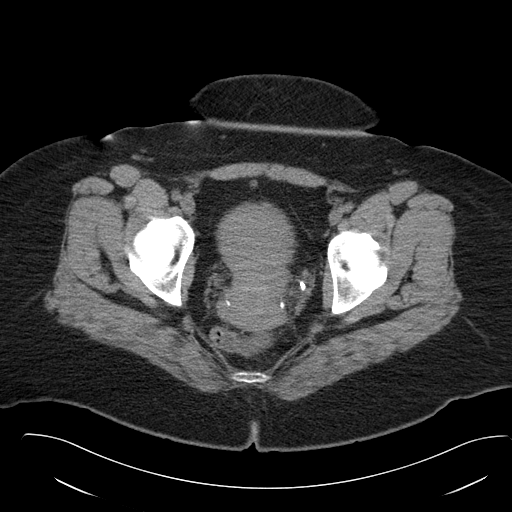
[im 30/105  soft-tissue]
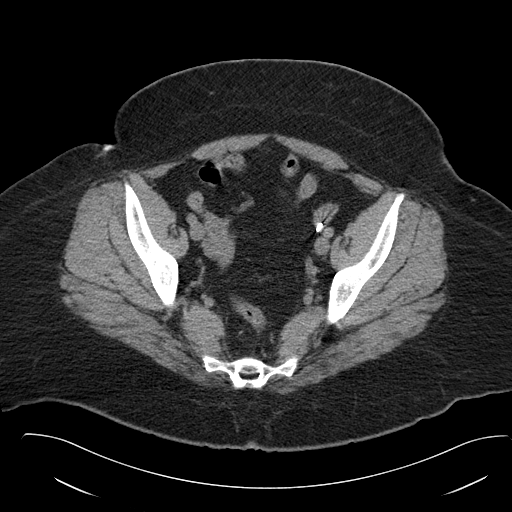
[im 38/105  soft-tissue]
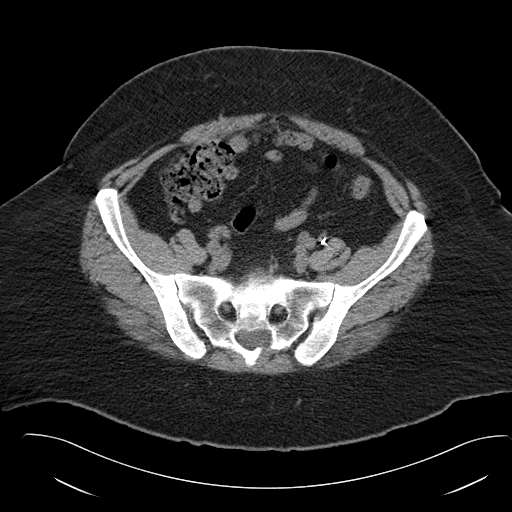
[im 46/105  soft-tissue]
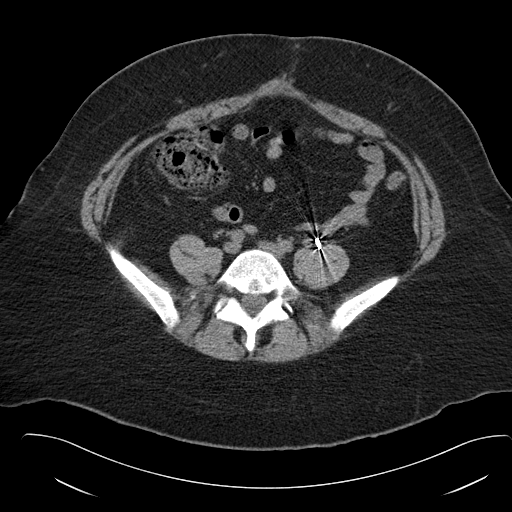
[im 55/105  soft-tissue]
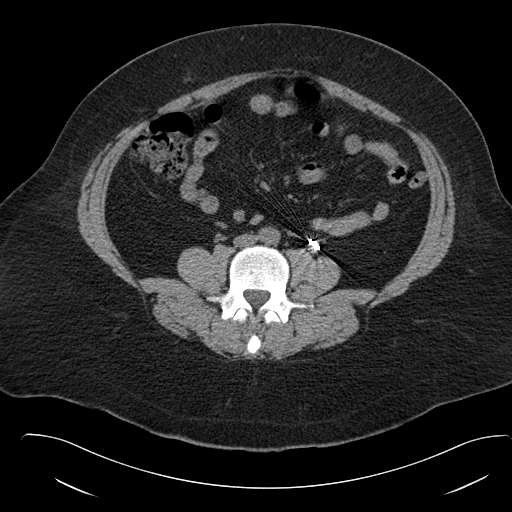
[im 59/105  soft-tissue]
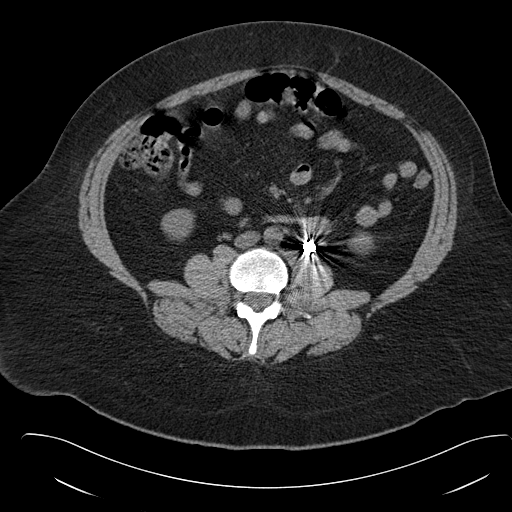
[im 67/105  soft-tissue]
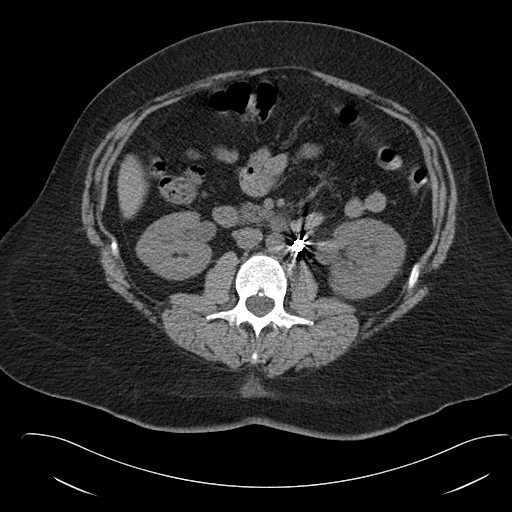
[im 67/105  bone]
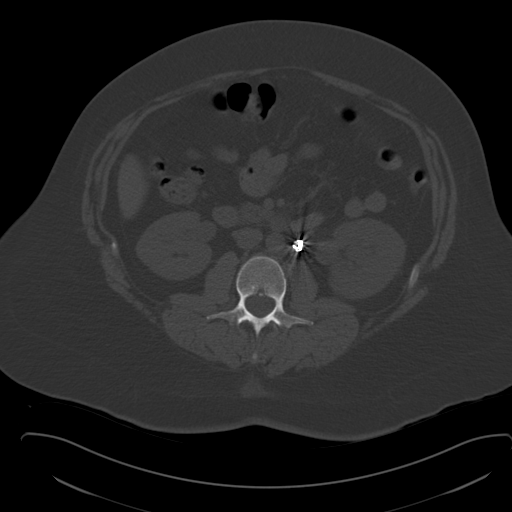
[im 75/105  soft-tissue]
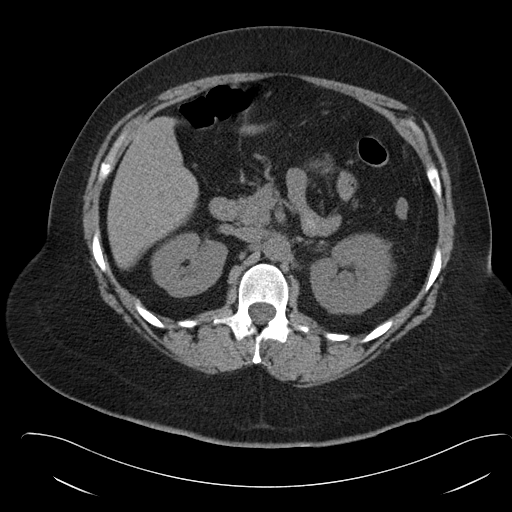
[im 84/105  soft-tissue]
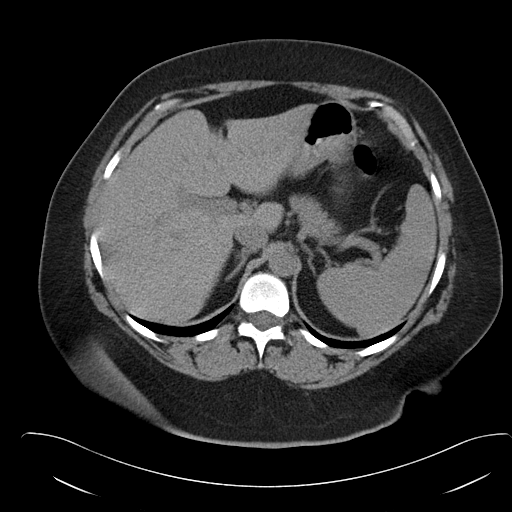
[im 88/105  lung]
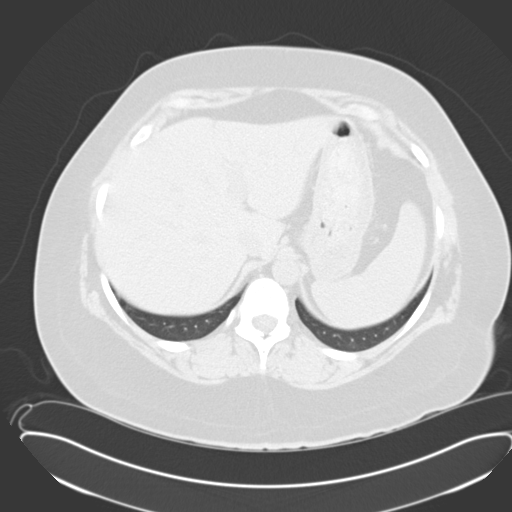
[im 92/105  soft-tissue]
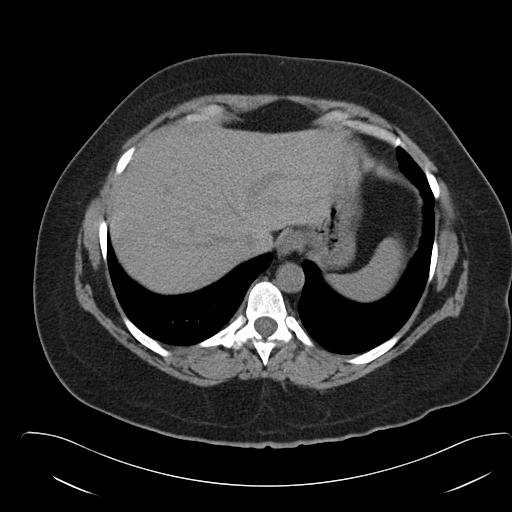
[im 92/105  lung]
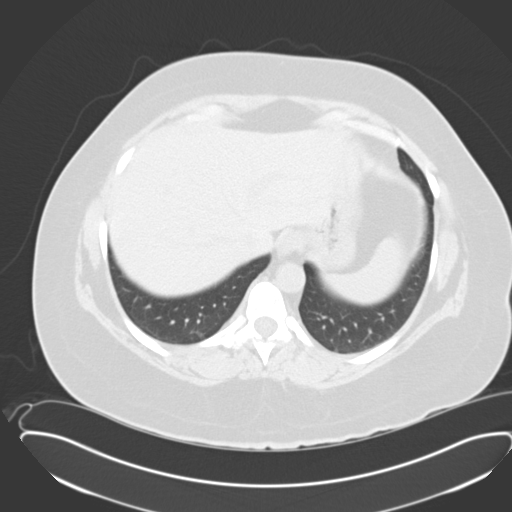
[im 96/105  lung]
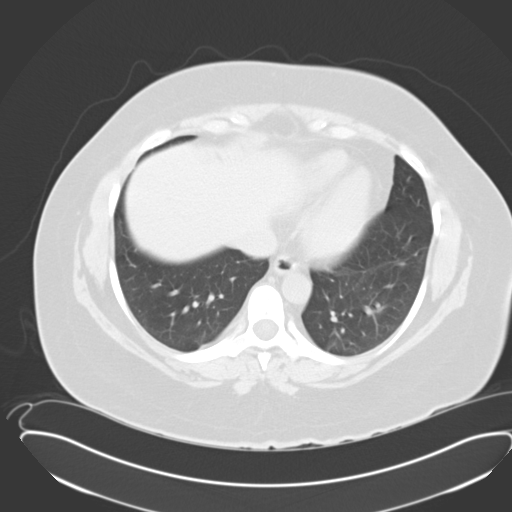
[im 100/105  soft-tissue]
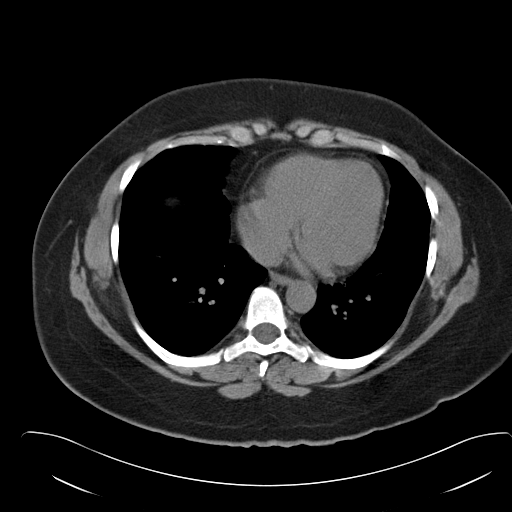
[im 100/105  lung]
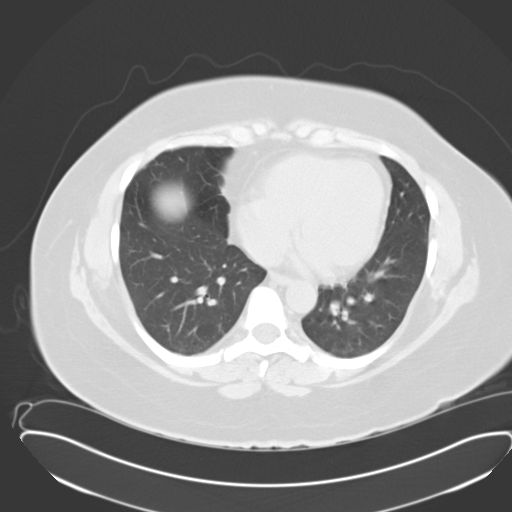

[15 of 32 positions shown; findings below may reference images not displayed]

FINDINGS: The visualized lung bases are clear.

The liver and spleen are unremarkable in appearance. The patient is
status post cholecystectomy, with clips noted at the gallbladder
fossa. The pancreas and adrenal glands are unremarkable.

There is mild left-sided hydronephrosis, with mild prominence of the
left ureter. There appears to be a relatively large obstructing
x 0.7 cm stone within the distal left ureter, 2 cm proximal to the
left vesicoureteral junction. A nonobstructing 3 mm stone is noted
at the upper pole of the right kidney. The right kidney is otherwise
unremarkable.

No free fluid is identified. The small bowel is unremarkable in
appearance. The stomach is within normal limits. No acute vascular
abnormalities are seen. Postoperative changes are noted along the
left ovarian vein.

The appendix is normal in caliber, without evidence of appendicitis.
The colon is unremarkable in appearance.

The bladder is mildly distended and grossly remarkable. The uterus
is within normal limits. The ovaries are grossly symmetric. No
suspicious adnexal masses are seen. The patient is status post
bilateral tubal ligation. No inguinal lymphadenopathy is seen.

No acute osseous abnormalities are identified.
IMPRESSION: 1. Mild left-sided hydronephrosis, with a relatively large
obstructing 1.2 x 0.7 cm stone in the distal left ureter, 2 cm
proximal to the left vesicoureteral junction.
2. Nonobstructing 3 mm stone at the upper pole of the right kidney.

## 2016-05-29 IMAGING — US US PELVIS COMPLETE
1 series · 13 of 25 positions shown · non-contrast
Comparison: No prior.

CLINICAL DATA: Abnormal uterine bleeding.

EXAM:
TRANSABDOMINAL AND TRANSVAGINAL ULTRASOUND OF PELVIS
TECHNIQUE: Both transabdominal and transvaginal ultrasound examinations of the
pelvis were performed. Transabdominal technique was performed for
global imaging of the pelvis including uterus, ovaries, adnexal
regions, and pelvic cul-de-sac. It was necessary to proceed with
endovaginal exam following the transabdominal exam to visualize the
uterus and ovary.

[Series 1: us pelvis complete · 0.24mm/px · 13 of 97 slices shown]
[im 1/97]
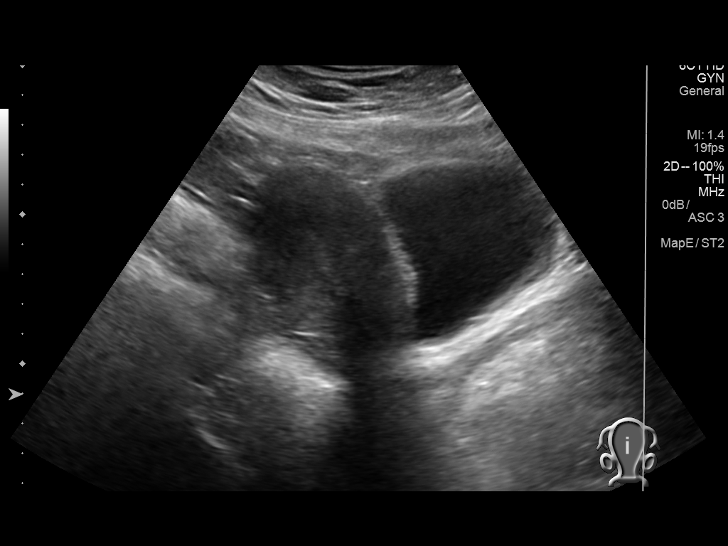
[im 9/97]
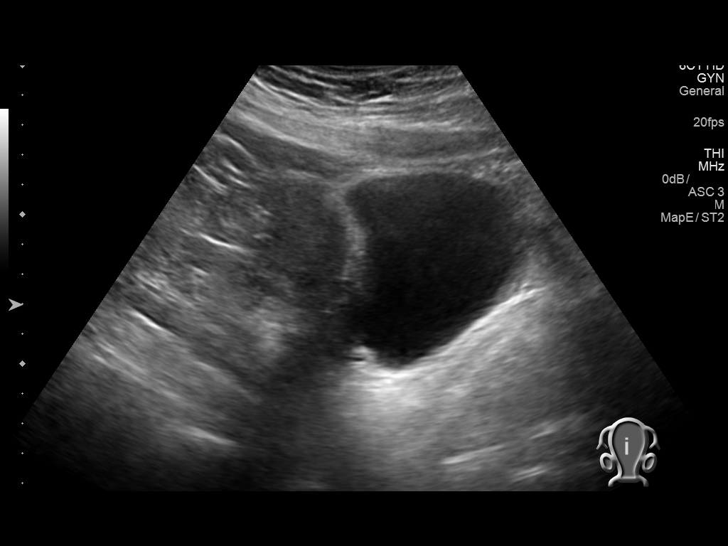
[im 17/97]
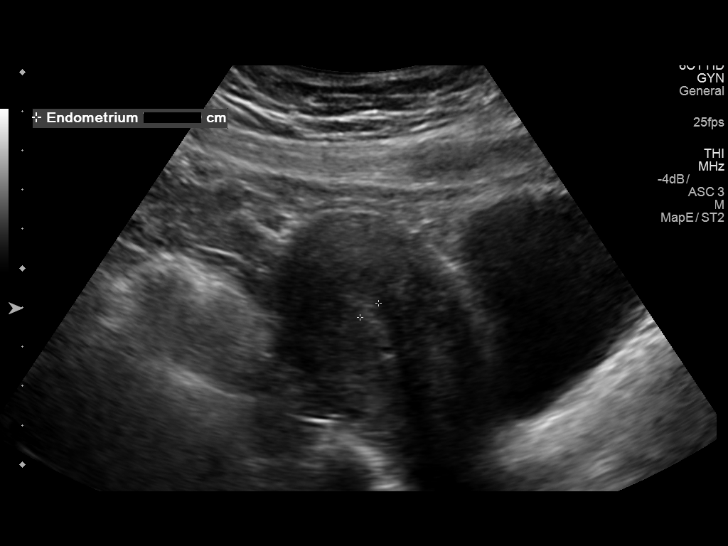
[im 25/97]
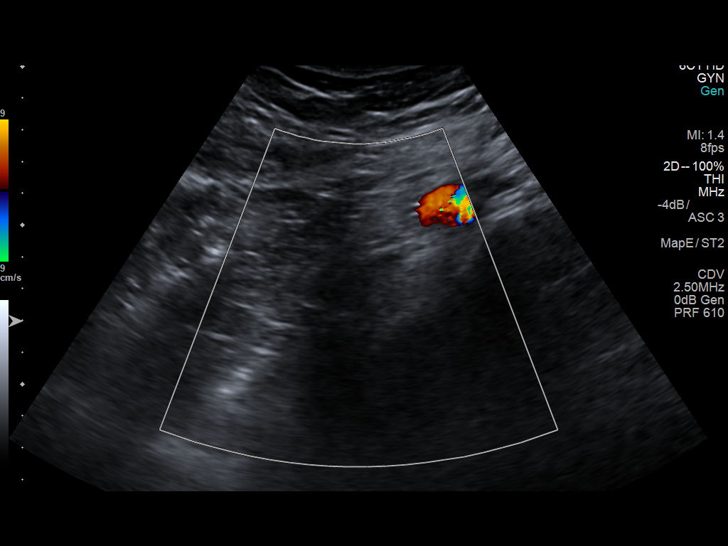
[im 33/97]
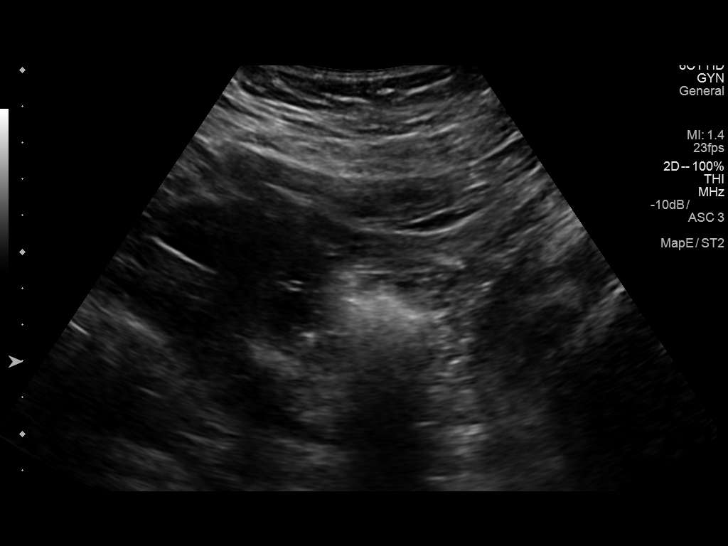
[im 41/97]
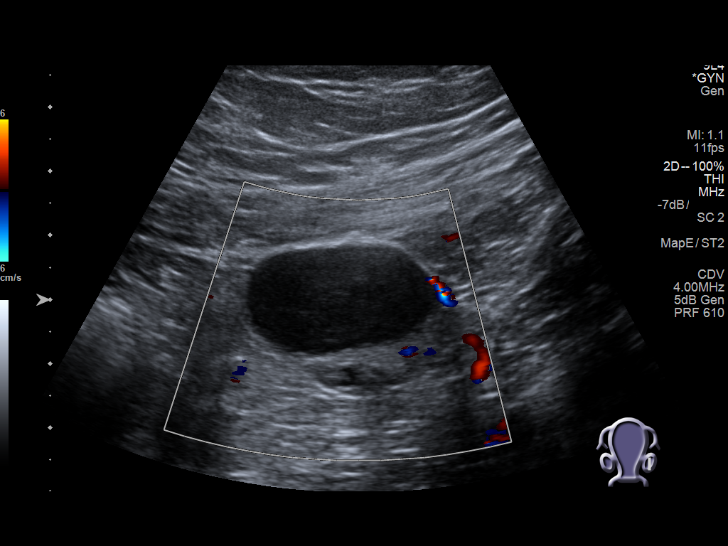
[im 49/97]
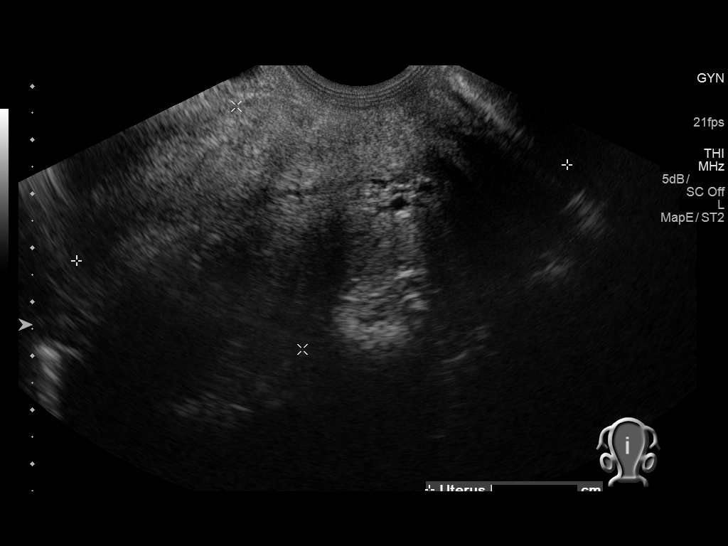
[im 57/97]
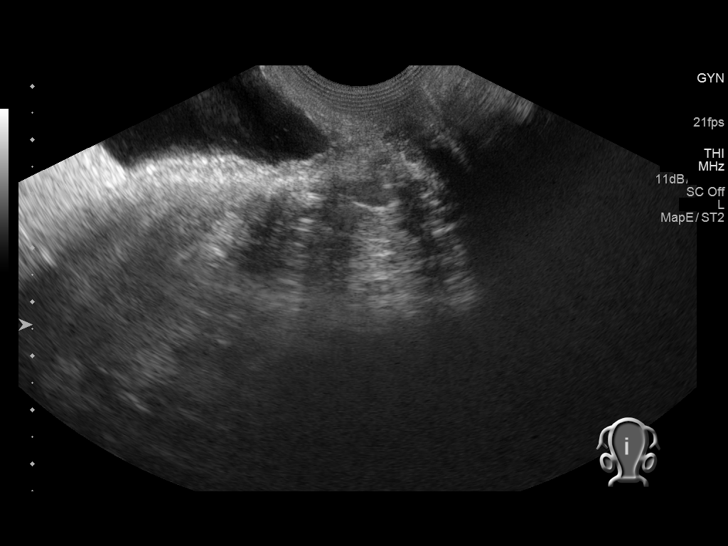
[im 65/97]
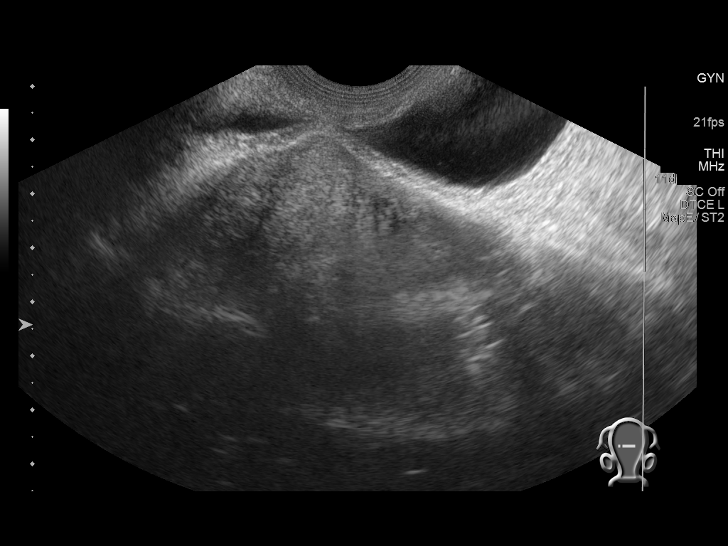
[im 73/97]
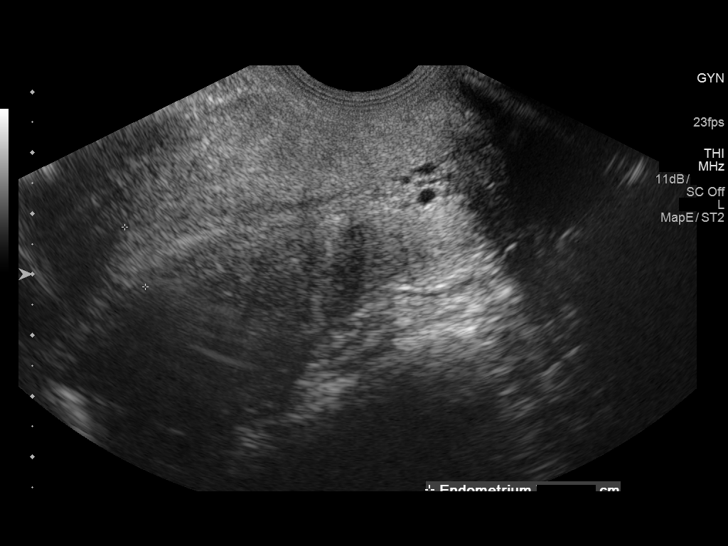
[im 81/97]
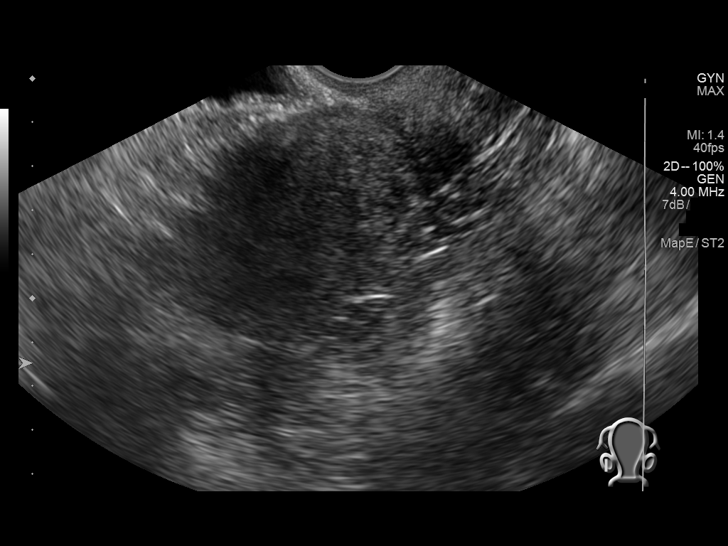
[im 89/97]
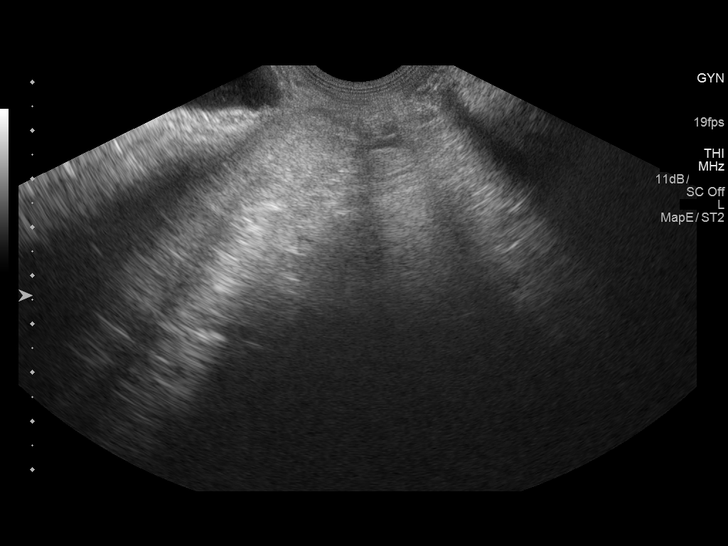
[im 97/97]
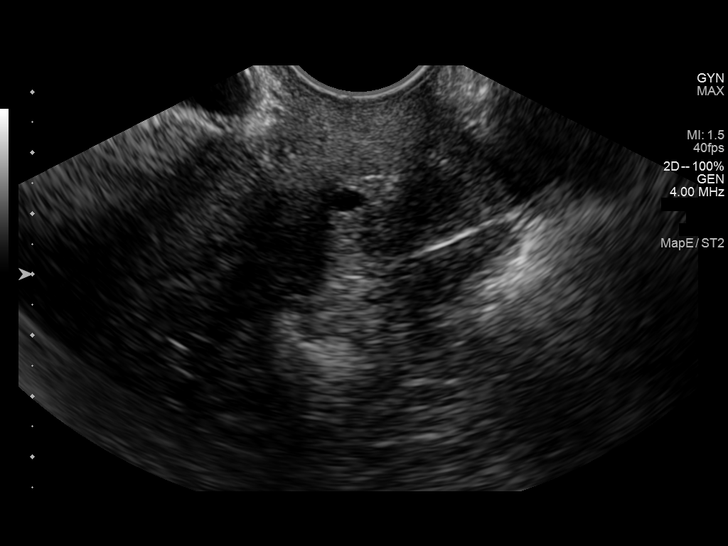

[13 of 25 positions shown; findings below may reference images not displayed]

FINDINGS: Uterus

Measurements: 9.3 x 4.7 x 4.6 cm. No fibroids or other mass
visualized. Small nabothian cysts.

Endometrium

Thickness: 10.4 mm. 3 mm echogenic focus noted along the upper
portion of the endometrial canal.

Right ovary

Measurements: 2.4 x 2.1 x 2.1 cm. Ovary is difficult to visualize
what appears to be the ovary appears normal.

Left ovary

Measurements: 3.7 x 2.8 x 3.7 cm. 3.2 x 2.2 x 3.0 cm simple cyst.

Other findings

No abnormal free fluid.
IMPRESSION: Tiny 3 mm echogenic focus noted along the upper portion of the
endometrial canal. A focal endometrial lesion cannot be excluded.
Consider sonohysterogram forfurther evaluation, prior to
hysteroscopy or endometrial biopsy.
